# Patient Record
Sex: Female | Born: 1995 | Hispanic: Yes | Marital: Married | Smoking: Never smoker
Health system: Southern US, Community
[De-identification: ages and names within clinical notes are randomized; demographics above are authoritative.]

## PROBLEM LIST (undated history)

## (undated) ENCOUNTER — Inpatient Hospital Stay (HOSPITAL_COMMUNITY): Payer: Self-pay

## (undated) DIAGNOSIS — Z789 Other specified health status: Secondary | ICD-10-CM

## (undated) HISTORY — PX: NO PAST SURGERIES: SHX2092

---
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
## 2016-08-11 NOTE — L&D Delivery Note (Signed)
Patient is 21 y.o. G1P0 [id number] admitted for SOL. AROM at 1310.    Delivery Note At 5:53 PM a viable female was delivered via Vaginal, Spontaneous (Presentation:  LOA).  APGAR: 9, 9; weight pending.   Placenta status: Intact, ?abruption with large clots s/p delivery of palcenta.  Cord: 3V with the following complications: None.  Cord pH: N/A  Anesthesia: Epidural  Episiotomy: None Lacerations: Left Labial - hemostatic Suture Repair:None Est. Blood Loss (mL): 250  Head delivered LOA. Loose nuchal cord x2 present. Shoulder and body delivered in usual fashion. Infant with spontaneous cry, placed on mother's abdomen, dried and bulb suctioned. Cord clamped x 2 after 1-minute delay, and cut by family member. Cord blood drawn. Placenta delivered spontaneously with gentle cord traction. Fundus firm with massage and Pitocin. Perineum inspected and found to have labial laceration, which was found to be hemostatic.  Mom to postpartum.  Baby to Couplet care / Skin to Skin.  Caryl AdaJazma Badr Piedra, DO OB Fellow

## 2017-04-16 ENCOUNTER — Encounter (HOSPITAL_COMMUNITY): Payer: Self-pay | Admitting: *Deleted

## 2017-04-16 ENCOUNTER — Inpatient Hospital Stay (HOSPITAL_COMMUNITY)
Admission: AD | Admit: 2017-04-16 | Discharge: 2017-04-17 | Disposition: A | Discharge status: Home / Self Care | Payer: Medicaid Other | Source: Ambulatory Visit | Attending: Obstetrics and Gynecology | Admitting: Obstetrics and Gynecology

## 2017-04-16 ENCOUNTER — Inpatient Hospital Stay (HOSPITAL_COMMUNITY): Payer: Medicaid Other

## 2017-04-16 DIAGNOSIS — Z3A26 26 weeks gestation of pregnancy: Secondary | ICD-10-CM | POA: Insufficient documentation

## 2017-04-16 DIAGNOSIS — O26852 Spotting complicating pregnancy, second trimester: Secondary | ICD-10-CM | POA: Diagnosis not present

## 2017-04-16 DIAGNOSIS — O26853 Spotting complicating pregnancy, third trimester: Secondary | ICD-10-CM | POA: Diagnosis not present

## 2017-04-16 DIAGNOSIS — O4692 Antepartum hemorrhage, unspecified, second trimester: Secondary | ICD-10-CM | POA: Diagnosis present

## 2017-04-16 LAB — URINALYSIS, ROUTINE W REFLEX MICROSCOPIC
Bilirubin Urine: NEGATIVE
GLUCOSE, UA: NEGATIVE mg/dL
HGB URINE DIPSTICK: NEGATIVE
Ketones, ur: NEGATIVE mg/dL
Leukocytes, UA: NEGATIVE
Nitrite: NEGATIVE
PH: 7 (ref 5.0–8.0)
PROTEIN: NEGATIVE mg/dL
Specific Gravity, Urine: 1.02 (ref 1.005–1.030)

## 2017-04-16 LAB — WET PREP, GENITAL
SPERM: NONE SEEN
Trich, Wet Prep: NONE SEEN
Yeast Wet Prep HPF POC: NONE SEEN

## 2017-04-16 LAB — CBC
HEMATOCRIT: 33.5 % — AB (ref 36.0–46.0)
HEMOGLOBIN: 11.3 g/dL — AB (ref 12.0–15.0)
MCH: 29.1 pg (ref 26.0–34.0)
MCHC: 33.7 g/dL (ref 30.0–36.0)
MCV: 86.3 fL (ref 78.0–100.0)
Platelets: 252 10*3/uL (ref 150–400)
RBC: 3.88 MIL/uL (ref 3.87–5.11)
RDW: 13.5 % (ref 11.5–15.5)
WBC: 11.2 10*3/uL — ABNORMAL HIGH (ref 4.0–10.5)

## 2017-04-16 LAB — POCT PREGNANCY, URINE: Preg Test, Ur: POSITIVE — AB

## 2017-04-16 NOTE — MAU Provider Note (Signed)
History     CSN: [id number]  Arrival date and time: 04/16/17 1947   First Provider Initiated Contact with Patient 04/16/17 2229      Chief Complaint  Patient presents with  . Vaginal Bleeding   Vaginal Bleeding  The patient's primary symptoms include vaginal bleeding. The patient's pertinent negatives include no pelvic pain. This is a new problem. The current episode started yesterday. The problem occurs constantly. The problem has been unchanged. The patient is experiencing no pain. She is pregnant. Associated symptoms include nausea. Pertinent negatives include no chills, dysuria, fever, frequency or vomiting. The vaginal bleeding is lighter than menses. She has not been passing clots. She has not been passing tissue. Nothing aggravates the symptoms. She has tried nothing for the symptoms. She uses nothing for contraception. Her menstrual history has been irregular (LMP: approx 03/17/17, but last NORMAL period was 10/15/16 ).   History reviewed. No pertinent past medical history.  History reviewed. No pertinent surgical history.  Family History  Problem Relation Age of Onset  . Diabetes Maternal Grandmother   . Hypertension Maternal Grandmother   . Arthritis Maternal Grandmother   . Hypoparathyroidism Maternal Grandmother   . Cancer Maternal Grandfather     Social History  Substance Use Topics  . Smoking status: Never Smoker  . Smokeless tobacco: Never Used  . Alcohol use No    Allergies: No Known Allergies  Prescriptions Prior to Admission  Medication Sig Dispense Refill Last Dose  . Pediatric Multiple Vit-C-FA (FLINSTONES GUMMIES OMEGA-3 DHA) CHEW Chew by mouth.   04/16/2017 at Unknown time    Review of Systems  Constitutional: Negative for chills and fever.  Gastrointestinal: Positive for nausea. Negative for vomiting.  Genitourinary: Positive for vaginal bleeding. Negative for dysuria, frequency and pelvic pain.   Physical Exam   Blood pressure (!) 106/55, pulse 91,  temperature 98.1 F (36.7 C), temperature source Oral, resp. rate 18, height 4' 10.5" (1.486 m), weight 128 lb 8 oz (58.3 kg), last menstrual period 10/15/2016.  Physical Exam  Nursing note and vitals reviewed. Constitutional: She is oriented to person, place, and time. She appears well-developed and well-nourished. No distress.  HENT:  Head: Normocephalic.  Cardiovascular: Normal rate.   Respiratory: Effort normal.  GI: Soft. There is no tenderness. There is no rebound.  Fundal height 23cm, +FHT 145 with doppler   Neurological: She is alert and oriented to person, place, and time.  Skin: Skin is warm and dry.  Psychiatric: She has a normal mood and affect.  [place]: no previa Cervical length 3,2cm EGA 27.0 with corresponds with patient's LNMP in March.    Results for orders placed or performed during the hospital encounter of 04/16/17 (from the past 24 hour(s))  Urinalysis, Routine w reflex microscopic     Status: Abnormal   Collection Time: 04/16/17  8:57 PM  Result Value Ref Range   Color, Urine YELLOW YELLOW   APPearance CLOUDY (A) CLEAR   Specific Gravity, Urine 1.020 1.005 - 1.030   pH 7.0 5.0 - 8.0   Glucose, UA NEGATIVE NEGATIVE mg/dL   Hgb urine dipstick NEGATIVE NEGATIVE   Bilirubin Urine NEGATIVE NEGATIVE   Ketones, ur NEGATIVE NEGATIVE mg/dL   Protein, ur NEGATIVE NEGATIVE mg/dL   Nitrite NEGATIVE NEGATIVE   Leukocytes, UA NEGATIVE NEGATIVE  Pregnancy, urine POC     Status: Abnormal   Collection Time: 04/16/17  9:26 PM  Result Value Ref Range   Preg Test, Ur POSITIVE (A) NEGATIVE  Wet prep,  genital     Status: Abnormal   Collection Time: 04/16/17 10:30 PM  Result Value Ref Range   Yeast Wet Prep HPF POC NONE SEEN NONE SEEN   Trich, Wet Prep NONE SEEN NONE SEEN   Clue Cells Wet Prep HPF POC PRESENT (A) NONE SEEN   WBC, Wet Prep HPF POC MODERATE (A) NONE SEEN   Sperm NONE SEEN   CBC     Status: Abnormal   Collection Time: 04/16/17 10:44 PM  Result Value Ref Range    WBC 11.2 (H) 4.0 - 10.5 K/uL   RBC 3.88 3.87 - 5.11 MIL/uL   Hemoglobin 11.3 (L) 12.0 - 15.0 g/dL   HCT 01.033.5 (L) 27.236.0 - 53.646.0 %   MCV 86.3 78.0 - 100.0 fL   MCH 29.1 26.0 - 34.0 pg   MCHC 33.7 30.0 - 36.0 g/dL   RDW 64.413.5 03.411.5 - 74.215.5 %   Platelets 252 150 - 400 K/uL  ABO/Rh     Status: None (Preliminary result)   Collection Time: 04/16/17 10:45 PM  Result Value Ref Range   ABO/RH(D) O POS      FHT: 145, moderate with 10x10 accels, no decels Toco: no UCs  MAU Course  Procedures  MDM   Assessment and Plan   1. [redacted] weeks gestation of pregnancy   2. Spotting affecting pregnancy in third trimester    DC home Comfort measures reviewed  3rd Trimester precautions  PTL precautions  Fetal kick counts RX: none  Return to MAU as needed FU with OB as planned  Follow-up Information    [facility].   Specialty:  Obstetrics and Gynecology Why:  CALL FOR AN APPOINTMENT ASAP Contact information: [address] [place] [id number] [phone]           Thressa ShellerHeather Hogan 04/17/2017, 12:11 AM

## 2017-04-16 NOTE — MAU Note (Signed)
LMP -8-7,.  VAG BLEEDING STARTED YESTERDAY AT [facility] - IN UNDERWEAR- STOPPED BLEEDING  AT 4 PM .    NO CRAMPS.    HEADACHE - STARTED AT [facility]-  NO MEDS . - HAD AN ARGUMENT  AND CRYING LAST NIGHT.    NO H/A  NOW IN TRIAGE     DID HPT- 3 WEEKS - POSITIVE.

## 2017-04-17 DIAGNOSIS — Z3A26 26 weeks gestation of pregnancy: Secondary | ICD-10-CM | POA: Diagnosis not present

## 2017-04-17 DIAGNOSIS — O26853 Spotting complicating pregnancy, third trimester: Secondary | ICD-10-CM | POA: Diagnosis not present

## 2017-04-17 LAB — GC/CHLAMYDIA PROBE AMP (~~LOC~~) NOT AT ARMC
Chlamydia: NEGATIVE
NEISSERIA GONORRHEA: NEGATIVE

## 2017-04-17 LAB — ABO/RH: ABO/RH(D): O POS

## 2017-04-17 NOTE — Discharge Instructions (Signed)
Third Trimester of Pregnancy The third trimester is from week 28 through week 40 (months 7 through 9). The third trimester is a time when the unborn baby (fetus) is growing rapidly. At the end of the ninth month, the fetus is about 20 inches in length and weighs 6-10 pounds. Body changes during your third trimester Your body will continue to go through many changes during pregnancy. The changes vary from woman to woman. During the third trimester:  Your weight will continue to increase. You can expect to gain 25-35 pounds (11-16 kg) by the end of the pregnancy.  You may begin to get stretch marks on your hips, abdomen, and breasts.  You may urinate more often because the fetus is moving lower into your pelvis and pressing on your bladder.  You may develop or continue to have heartburn. This is caused by increased hormones that slow down muscles in the digestive tract.  You may develop or continue to have constipation because increased hormones slow digestion and cause the muscles that push waste through your intestines to relax.  You may develop hemorrhoids. These are swollen veins (varicose veins) in the rectum that can itch or be painful.  You may develop swollen, bulging veins (varicose veins) in your legs.  You may have increased body aches in the pelvis, back, or thighs. This is due to weight gain and increased hormones that are relaxing your joints.  You may have changes in your hair. These can include thickening of your hair, rapid growth, and changes in texture. Some women also have hair loss during or after pregnancy, or hair that feels dry or thin. Your hair will most likely return to normal after your baby is born.  Your breasts will continue to grow and they will continue to become tender. A yellow fluid (colostrum) may leak from your breasts. This is the first milk you are producing for your baby.  Your belly button may stick out.  You may notice more swelling in your hands,  face, or ankles.  You may have increased tingling or numbness in your hands, arms, and legs. The skin on your belly may also feel numb.  You may feel short of breath because of your expanding uterus.  You may have more problems sleeping. This can be caused by the size of your belly, increased need to urinate, and an increase in your body's metabolism.  You may notice the fetus "dropping," or moving lower in your abdomen (lightening).  You may have increased vaginal discharge.  You may notice your joints feel loose and you may have pain around your pelvic bone.  What to expect at prenatal visits You will have prenatal exams every 2 weeks until week 36. Then you will have weekly prenatal exams. During a routine prenatal visit:  You will be weighed to make sure you and the baby are growing normally.  Your blood pressure will be taken.  Your abdomen will be measured to track your baby's growth.  The fetal heartbeat will be listened to.  Any test results from the previous visit will be discussed.  You may have a cervical check near your due date to see if your cervix has softened or thinned (effaced).  You will be tested for Group B streptococcus. This happens between 35 and 37 weeks.  Your health care provider may ask you:  What your birth plan is.  How you are feeling.  If you are feeling the baby move.  If you have had   any abnormal symptoms, such as leaking fluid, bleeding, severe headaches, or abdominal cramping.  If you are using any tobacco products, including cigarettes, chewing tobacco, and electronic cigarettes.  If you have any questions.  Other tests or screenings that may be performed during your third trimester include:  Blood tests that check for low iron levels (anemia).  Fetal testing to check the health, activity level, and growth of the fetus. Testing is done if you have certain medical conditions or if there are problems during the  pregnancy.  Nonstress test (NST). This test checks the health of your baby to make sure there are no signs of problems, such as the baby not getting enough oxygen. During this test, a belt is placed around your belly. The baby is made to move, and its heart rate is monitored during movement.  What is false labor? False labor is a condition in which you feel small, irregular tightenings of the muscles in the womb (contractions) that usually go away with rest, changing position, or drinking water. These are called Braxton Hicks contractions. Contractions may last for hours, days, or even weeks before true labor sets in. If contractions come at regular intervals, become more frequent, increase in intensity, or become painful, you should see your health care provider. What are the signs of labor?  Abdominal cramps.  Regular contractions that start at 10 minutes apart and become stronger and more frequent with time.  Contractions that start on the top of the uterus and spread down to the lower abdomen and back.  Increased pelvic pressure and dull back pain.  A watery or bloody mucus discharge that comes from the vagina.  Leaking of amniotic fluid. This is also known as your "water breaking." It could be a slow trickle or a gush. Let your health care provider know if it has a color or strange odor. If you have any of these signs, call your health care provider right away, even if it is before your due date. Follow these instructions at home: Medicines  Follow your health care provider's instructions regarding medicine use. Specific medicines may be either safe or unsafe to take during pregnancy.  Take a prenatal vitamin that contains at least 600 micrograms (mcg) of folic acid.  If you develop constipation, try taking a stool softener if your health care provider approves. Eating and drinking  Eat a balanced diet that includes fresh fruits and vegetables, whole grains, good sources of protein  such as meat, eggs, or tofu, and low-fat dairy. Your health care provider will help you determine the amount of weight gain that is right for you.  Avoid raw meat and uncooked cheese. These carry germs that can cause birth defects in the baby.  If you have low calcium intake from food, talk to your health care provider about whether you should take a daily calcium supplement.  Eat four or five small meals rather than three large meals a day.  Limit foods that are high in fat and processed sugars, such as fried and sweet foods.  To prevent constipation: ? Drink enough fluid to keep your urine clear or pale yellow. ? Eat foods that are high in fiber, such as fresh fruits and vegetables, whole grains, and beans. Activity  Exercise only as directed by your health care provider. Most women can continue their usual exercise routine during pregnancy. Try to exercise for 30 minutes at least 5 days a week. Stop exercising if you experience uterine contractions.  Avoid heavy   lifting.  Do not exercise in extreme heat or humidity, or at high altitudes.  Wear low-heel, comfortable shoes.  Practice good posture.  You may continue to have sex unless your health care provider tells you otherwise. Relieving pain and discomfort  Take frequent breaks and rest with your legs elevated if you have leg cramps or low back pain.  Take warm sitz baths to soothe any pain or discomfort caused by hemorrhoids. Use hemorrhoid cream if your health care provider approves.  Wear a good support bra to prevent discomfort from breast tenderness.  If you develop varicose veins: ? Wear support pantyhose or compression stockings as told by your healthcare provider. ? Elevate your feet for 15 minutes, 3-4 times a day. Prenatal care  Write down your questions. Take them to your prenatal visits.  Keep all your prenatal visits as told by your health care provider. This is important. Safety  Wear your seat belt at  all times when driving.  Make a list of emergency phone numbers, including numbers for family, friends, the hospital, and police and fire departments. General instructions  Avoid cat litter boxes and soil used by cats. These carry germs that can cause birth defects in the baby. If you have a cat, ask someone to clean the litter box for you.  Do not travel far distances unless it is absolutely necessary and only with the approval of your health care provider.  Do not use hot tubs, steam rooms, or saunas.  Do not drink alcohol.  Do not use any products that contain nicotine or tobacco, such as cigarettes and e-cigarettes. If you need help quitting, ask your health care provider.  Do not use any medicinal herbs or unprescribed drugs. These chemicals affect the formation and growth of the baby.  Do not douche or use tampons or scented sanitary pads.  Do not cross your legs for long periods of time.  To prepare for the arrival of your baby: ? Take prenatal classes to understand, practice, and ask questions about labor and delivery. ? Make a trial run to the hospital. ? Visit the hospital and tour the maternity area. ? Arrange for maternity or paternity leave through employers. ? Arrange for family and friends to take care of pets while you are in the hospital. ? Purchase a rear-facing car seat and make sure you know how to install it in your car. ? Pack your hospital bag. ? Prepare the baby's nursery. Make sure to remove all pillows and stuffed animals from the baby's crib to prevent suffocation.  Visit your dentist if you have not gone during your pregnancy. Use a soft toothbrush to brush your teeth and be gentle when you floss. Contact a health care provider if:  You are unsure if you are in labor or if your water has broken.  You become dizzy.  You have mild pelvic cramps, pelvic pressure, or nagging pain in your abdominal area.  You have lower back pain.  You have persistent  nausea, vomiting, or diarrhea.  You have an unusual or bad smelling vaginal discharge.  You have pain when you urinate. Get help right away if:  Your water breaks before 37 weeks.  You have regular contractions less than 5 minutes apart before 37 weeks.  You have a fever.  You are leaking fluid from your vagina.  You have spotting or bleeding from your vagina.  You have severe abdominal pain or cramping.  You have rapid weight loss or weight gain.    You have shortness of breath with chest pain.  You notice sudden or extreme swelling of your face, hands, ankles, feet, or legs.  Your baby makes fewer than 10 movements in 2 hours.  You have severe headaches that do not go away when you take medicine.  You have vision changes. Summary  The third trimester is from week 28 through week 40, months 7 through 9. The third trimester is a time when the unborn baby (fetus) is growing rapidly.  During the third trimester, your discomfort may increase as you and your baby continue to gain weight. You may have abdominal, leg, and back pain, sleeping problems, and an increased need to urinate.  During the third trimester your breasts will keep growing and they will continue to become tender. A yellow fluid (colostrum) may leak from your breasts. This is the first milk you are producing for your baby.  False labor is a condition in which you feel small, irregular tightenings of the muscles in the womb (contractions) that eventually go away. These are called Braxton Hicks contractions. Contractions may last for hours, days, or even weeks before true labor sets in.  Signs of labor can include: abdominal cramps; regular contractions that start at 10 minutes apart and become stronger and more frequent with time; watery or bloody mucus discharge that comes from the vagina; increased pelvic pressure and dull back pain; and leaking of amniotic fluid. This information is not intended to replace advice  given to you by your health care provider. Make sure you discuss any questions you have with your health care provider. Document Released: 07/22/2001 Document Revised: 01/03/2016 Document Reviewed: 09/28/2012 [employer] Interactive Patient Education  2017 [employer]  

## 2017-05-06 ENCOUNTER — Ambulatory Visit (INDEPENDENT_AMBULATORY_CARE_PROVIDER_SITE_OTHER): Payer: Self-pay | Admitting: Medical

## 2017-05-06 ENCOUNTER — Encounter: Payer: Self-pay | Admitting: Medical

## 2017-05-06 VITALS — BP 104/56 | HR 82 | Wt 128.5 lb

## 2017-05-06 DIAGNOSIS — Z3400 Encounter for supervision of normal first pregnancy, unspecified trimester: Secondary | ICD-10-CM

## 2017-05-06 DIAGNOSIS — Z3403 Encounter for supervision of normal first pregnancy, third trimester: Secondary | ICD-10-CM

## 2017-05-06 NOTE — Patient Instructions (Signed)
Fetal Movement Counts °Patient Name: ________________________________________________ Patient Due Date: ____________________ °What is a fetal movement count? °A fetal movement count is the number of times that you feel your baby move during a certain amount of time. This may also be called a fetal kick count. A fetal movement count is recommended for every pregnant woman. You may be asked to start counting fetal movements as early as week 28 of your pregnancy. °Pay attention to when your baby is most active. You may notice your baby's sleep and wake cycles. You may also notice things that make your baby move more. You should do a fetal movement count: °· When your baby is normally most active. °· At the same time each day. ° °A good time to count movements is while you are resting, after having something to eat and drink. °How do I count fetal movements? °1. Find a quiet, comfortable area. Sit, or lie down on your side. °2. Write down the date, the start time and stop time, and the number of movements that you felt between those two times. Take this information with you to your health care visits. °3. For 2 hours, count kicks, flutters, swishes, rolls, and jabs. You should feel at least 10 movements during 2 hours. °4. You may stop counting after you have felt 10 movements. °5. If you do not feel 10 movements in 2 hours, have something to eat and drink. Then, keep resting and counting for 1 hour. If you feel at least 4 movements during that hour, you may stop counting. °Contact a health care provider if: °· You feel fewer than 4 movements in 2 hours. °· Your baby is not moving like he or she usually does. °Date: ____________ Start time: ____________ Stop time: ____________ Movements: ____________ °Date: ____________ Start time: ____________ Stop time: ____________ Movements: ____________ °Date: ____________ Start time: ____________ Stop time: ____________ Movements: ____________ °Date: ____________ Start time:  ____________ Stop time: ____________ Movements: ____________ °Date: ____________ Start time: ____________ Stop time: ____________ Movements: ____________ °Date: ____________ Start time: ____________ Stop time: ____________ Movements: ____________ °Date: ____________ Start time: ____________ Stop time: ____________ Movements: ____________ °Date: ____________ Start time: ____________ Stop time: ____________ Movements: ____________ °Date: ____________ Start time: ____________ Stop time: ____________ Movements: ____________ °This information is not intended to replace advice given to you by your health care provider. Make sure you discuss any questions you have with your health care provider. °Document Released: 08/27/2006 Document Revised: 03/26/2016 Document Reviewed: 09/06/2015 °Elsevier Interactive Patient Education © 2018 Elsevier Inc[employer] °Braxton Hicks Contractions °Contractions of the uterus can occur throughout pregnancy, but they are not always a sign that you are in labor. You may have practice contractions called Braxton Hicks contractions. These false labor contractions are sometimes confused with true labor. °What are Braxton Hicks contractions? °Braxton Hicks contractions are tightening movements that occur in the muscles of the uterus before labor. Unlike true labor contractions, these contractions do not result in opening (dilation) and thinning of the cervix. Toward the end of pregnancy (32-34 weeks), Braxton Hicks contractions can happen more often and may become stronger. These contractions are sometimes difficult to tell apart from true labor because they can be very uncomfortable. You should not feel embarrassed if you go to the hospital with false labor. °Sometimes, the only way to tell if you are in true labor is for your health care provider to look for changes in the cervix. The health care provider will do a physical exam and may monitor your contractions. If   you are not in true labor, the exam  should show that your cervix is not dilating and your water has not broken. °If there are no prenatal problems or other health problems associated with your pregnancy, it is completely safe for you to be sent home with false labor. You may continue to have Braxton Hicks contractions until you go into true labor. °How can I tell the difference between true labor and false labor? °· Differences °? False labor °? Contractions last 30-70 seconds.: Contractions are usually shorter and not as strong as true labor contractions. °? Contractions become very regular.: Contractions are usually irregular. °? Discomfort is usually felt in the top of the uterus, and it spreads to the lower abdomen and low back.: Contractions are often felt in the front of the lower abdomen and in the groin. °? Contractions do not go away with walking.: Contractions may go away when you walk around or change positions while lying down. °? Contractions usually become more intense and increase in frequency.: Contractions get weaker and are shorter-lasting as time goes on. °? The cervix dilates and gets thinner.: The cervix usually does not dilate or become thin. °Follow these instructions at home: °· Take over-the-counter and prescription medicines only as told by your health care provider. °· Keep up with your usual exercises and follow other instructions from your health care provider. °· Eat and drink lightly if you think you are going into labor. °· If Braxton Hicks contractions are making you uncomfortable: °? Change your position from lying down or resting to walking, or change from walking to resting. °? Sit and rest in a tub of warm water. °? Drink enough fluid to keep your urine clear or pale yellow. Dehydration may cause these contractions. °? Do slow and deep breathing several times an hour. °· Keep all follow-up prenatal visits as told by your health care provider. This is important. °Contact a health care provider if: °· You have a  fever. °· You have continuous pain in your abdomen. °Get help right away if: °· Your contractions become stronger, more regular, and closer together. °· You have fluid leaking or gushing from your vagina. °· You pass blood-tinged mucus (bloody show). °· You have bleeding from your vagina. °· You have low back pain that you never had before. °· You feel your baby’s head pushing down and causing pelvic pressure. °· Your baby is not moving inside you as much as it used to. °Summary °· Contractions that occur before labor are called Braxton Hicks contractions, false labor, or practice contractions. °· Braxton Hicks contractions are usually shorter, weaker, farther apart, and less regular than true labor contractions. True labor contractions usually become progressively stronger and regular and they become more frequent. °· Manage discomfort from Braxton Hicks contractions by changing position, resting in a warm bath, drinking plenty of water, or practicing deep breathing. °This information is not intended to replace advice given to you by your health care provider. Make sure you discuss any questions you have with your health care provider. °Document Released: 07/28/2005 Document Revised: 06/16/2016 Document Reviewed: 06/16/2016 °Elsevier Interactive Patient Education © 2017 Elsevier Inc. ° °

## 2017-05-06 NOTE — Progress Notes (Signed)
Pt will return on Friday at 8 am to do initial labs and 2hr gtt. Will also get tdap and flu at this time.

## 2017-05-06 NOTE — Progress Notes (Signed)
   PRENATAL VISIT NOTE  Subjective:  Linda Zavala is a 21 y.o. G1P0 at [redacted]w[redacted]d being seen today for initial prenatal visit. She is currently monitored for the following issues for this low-risk pregnancy and has Supervision of normal first pregnancy, antepartum on her problem list.  Patient reports no complaints.  Contractions: Irritability. Vag. Bleeding: None.  Movement: Present. Denies leaking of fluid.   The following portions of the patient's history were reviewed and updated as appropriate: allergies, current medications, past family history, past medical history, past social history, past surgical history and problem list. Problem list updated.  Objective:   Vitals:   05/06/17 1441  BP: (!) 104/56  Pulse: 82  Weight: 128 lb 8 oz (58.3 kg)    Fetal Status: Fetal Heart Rate (bpm): 150 Fundal Height: 26 cm Movement: Present     General:  Alert, oriented and cooperative. Patient is in no acute distress.  Skin: Skin is warm and dry. No rash noted.   Cardiovascular: Normal heart rate noted  Respiratory: Normal respiratory effort, no problems with respiration noted  Abdomen: Soft, gravid, appropriate for gestational age.  Pain/Pressure: Present     Pelvic: Cervical exam deferred        Extremities: Normal range of motion.  Edema: None  Mental Status:  Normal mood and affect. Normal behavior. Normal judgment and thought content.   Assessment and Plan:  Pregnancy: G1P0 at [redacted]w[redacted]d  1. Supervision of normal first pregnancy, antepartum - [place] MFM OB COMP + 14 WK; scheduled for Friday - Patient to return for prenatal labs and 2 hour GTT on Friday morning when fasting - Recent GC/Chlmaydia negative in [facility]  - Discussed the nature of our practice and expectations for visit timeline for this pregnancy - All questions answered  Preterm labor symptoms and general obstetric precautions including but not limited to vaginal bleeding, contractions, leaking of fluid and fetal movement were  reviewed in detail with the patient. Please refer to After Visit Summary for other counseling recommendations.  Return in about 2 weeks (around 05/20/2017) for LOB.   Vonzella Nipple, PA-C

## 2017-05-08 ENCOUNTER — Other Ambulatory Visit: Payer: Self-pay | Admitting: Medical

## 2017-05-08 ENCOUNTER — Other Ambulatory Visit: Payer: Self-pay

## 2017-05-08 ENCOUNTER — Encounter (HOSPITAL_COMMUNITY): Payer: Self-pay

## 2017-05-08 ENCOUNTER — Ambulatory Visit (HOSPITAL_COMMUNITY)
Admission: RE | Admit: 2017-05-08 | Discharge: 2017-05-08 | Disposition: A | Discharge status: Home / Self Care | Payer: Medicaid Other | Source: Ambulatory Visit | Attending: Medical | Admitting: Medical

## 2017-05-08 DIAGNOSIS — Z3689 Encounter for other specified antenatal screening: Secondary | ICD-10-CM | POA: Diagnosis not present

## 2017-05-08 DIAGNOSIS — O0933 Supervision of pregnancy with insufficient antenatal care, third trimester: Secondary | ICD-10-CM

## 2017-05-08 DIAGNOSIS — Z3A29 29 weeks gestation of pregnancy: Secondary | ICD-10-CM | POA: Insufficient documentation

## 2017-05-08 DIAGNOSIS — Z3400 Encounter for supervision of normal first pregnancy, unspecified trimester: Secondary | ICD-10-CM

## 2017-05-08 HISTORY — DX: Other specified health status: Z78.9

## 2017-05-09 LAB — HIV ANTIBODY (ROUTINE TESTING W REFLEX): HIV SCREEN 4TH GENERATION: NONREACTIVE

## 2017-05-09 LAB — CBC
HEMOGLOBIN: 11.9 g/dL (ref 11.1–15.9)
Hematocrit: 35.3 % (ref 34.0–46.6)
MCH: 29.1 pg (ref 26.6–33.0)
MCHC: 33.7 g/dL (ref 31.5–35.7)
MCV: 86 fL (ref 79–97)
Platelets: 252 10*3/uL (ref 150–379)
RBC: 4.09 x10E6/uL (ref 3.77–5.28)
RDW: 14.7 % (ref 12.3–15.4)
WBC: 9 10*3/uL (ref 3.4–10.8)

## 2017-05-09 LAB — GLUCOSE TOLERANCE, 2 HOURS W/ 1HR
GLUCOSE, 1 HOUR: 97 mg/dL (ref 65–179)
GLUCOSE, 2 HOUR: 89 mg/dL (ref 65–152)
Glucose, Fasting: 78 mg/dL (ref 65–91)

## 2017-05-09 LAB — RPR: RPR: NONREACTIVE

## 2017-05-20 ENCOUNTER — Ambulatory Visit (INDEPENDENT_AMBULATORY_CARE_PROVIDER_SITE_OTHER): Payer: Self-pay | Admitting: Medical

## 2017-05-20 VITALS — BP 121/64 | HR 79 | Wt 128.0 lb

## 2017-05-20 DIAGNOSIS — Z3403 Encounter for supervision of normal first pregnancy, third trimester: Secondary | ICD-10-CM

## 2017-05-20 DIAGNOSIS — Z3400 Encounter for supervision of normal first pregnancy, unspecified trimester: Secondary | ICD-10-CM

## 2017-05-20 NOTE — Patient Instructions (Signed)
Fetal Movement Counts °Patient Name: ________________________________________________ Patient Due Date: ____________________ °What is a fetal movement count? °A fetal movement count is the number of times that you feel your baby move during a certain amount of time. This may also be called a fetal kick count. A fetal movement count is recommended for every pregnant woman. You may be asked to start counting fetal movements as early as week 28 of your pregnancy. °Pay attention to when your baby is most active. You may notice your baby's sleep and wake cycles. You may also notice things that make your baby move more. You should do a fetal movement count: °· When your baby is normally most active. °· At the same time each day. ° °A good time to count movements is while you are resting, after having something to eat and drink. °How do I count fetal movements? °1. Find a quiet, comfortable area. Sit, or lie down on your side. °2. Write down the date, the start time and stop time, and the number of movements that you felt between those two times. Take this information with you to your health care visits. °3. For 2 hours, count kicks, flutters, swishes, rolls, and jabs. You should feel at least 10 movements during 2 hours. °4. You may stop counting after you have felt 10 movements. °5. If you do not feel 10 movements in 2 hours, have something to eat and drink. Then, keep resting and counting for 1 hour. If you feel at least 4 movements during that hour, you may stop counting. °Contact a health care provider if: °· You feel fewer than 4 movements in 2 hours. °· Your baby is not moving like he or she usually does. °Date: ____________ Start time: ____________ Stop time: ____________ Movements: ____________ °Date: ____________ Start time: ____________ Stop time: ____________ Movements: ____________ °Date: ____________ Start time: ____________ Stop time: ____________ Movements: ____________ °Date: ____________ Start time:  ____________ Stop time: ____________ Movements: ____________ °Date: ____________ Start time: ____________ Stop time: ____________ Movements: ____________ °Date: ____________ Start time: ____________ Stop time: ____________ Movements: ____________ °Date: ____________ Start time: ____________ Stop time: ____________ Movements: ____________ °Date: ____________ Start time: ____________ Stop time: ____________ Movements: ____________ °Date: ____________ Start time: ____________ Stop time: ____________ Movements: ____________ °This information is not intended to replace advice given to you by your health care provider. Make sure you discuss any questions you have with your health care provider. °Document Released: 08/27/2006 Document Revised: 03/26/2016 Document Reviewed: 09/06/2015 °Elsevier Interactive Patient Education © 2018 Elsevier Inc[employer] °Braxton Hicks Contractions °Contractions of the uterus can occur throughout pregnancy, but they are not always a sign that you are in labor. You may have practice contractions called Braxton Hicks contractions. These false labor contractions are sometimes confused with true labor. °What are Braxton Hicks contractions? °Braxton Hicks contractions are tightening movements that occur in the muscles of the uterus before labor. Unlike true labor contractions, these contractions do not result in opening (dilation) and thinning of the cervix. Toward the end of pregnancy (32-34 weeks), Braxton Hicks contractions can happen more often and may become stronger. These contractions are sometimes difficult to tell apart from true labor because they can be very uncomfortable. You should not feel embarrassed if you go to the hospital with false labor. °Sometimes, the only way to tell if you are in true labor is for your health care provider to look for changes in the cervix. The health care provider will do a physical exam and may monitor your contractions. If   you are not in true labor, the exam  should show that your cervix is not dilating and your water has not broken. °If there are no prenatal problems or other health problems associated with your pregnancy, it is completely safe for you to be sent home with false labor. You may continue to have Braxton Hicks contractions until you go into true labor. °How can I tell the difference between true labor and false labor? °· Differences °? False labor °? Contractions last 30-70 seconds.: Contractions are usually shorter and not as strong as true labor contractions. °? Contractions become very regular.: Contractions are usually irregular. °? Discomfort is usually felt in the top of the uterus, and it spreads to the lower abdomen and low back.: Contractions are often felt in the front of the lower abdomen and in the groin. °? Contractions do not go away with walking.: Contractions may go away when you walk around or change positions while lying down. °? Contractions usually become more intense and increase in frequency.: Contractions get weaker and are shorter-lasting as time goes on. °? The cervix dilates and gets thinner.: The cervix usually does not dilate or become thin. °Follow these instructions at home: °· Take over-the-counter and prescription medicines only as told by your health care provider. °· Keep up with your usual exercises and follow other instructions from your health care provider. °· Eat and drink lightly if you think you are going into labor. °· If Braxton Hicks contractions are making you uncomfortable: °? Change your position from lying down or resting to walking, or change from walking to resting. °? Sit and rest in a tub of warm water. °? Drink enough fluid to keep your urine clear or pale yellow. Dehydration may cause these contractions. °? Do slow and deep breathing several times an hour. °· Keep all follow-up prenatal visits as told by your health care provider. This is important. °Contact a health care provider if: °· You have a  fever. °· You have continuous pain in your abdomen. °Get help right away if: °· Your contractions become stronger, more regular, and closer together. °· You have fluid leaking or gushing from your vagina. °· You pass blood-tinged mucus (bloody show). °· You have bleeding from your vagina. °· You have low back pain that you never had before. °· You feel your baby’s head pushing down and causing pelvic pressure. °· Your baby is not moving inside you as much as it used to. °Summary °· Contractions that occur before labor are called Braxton Hicks contractions, false labor, or practice contractions. °· Braxton Hicks contractions are usually shorter, weaker, farther apart, and less regular than true labor contractions. True labor contractions usually become progressively stronger and regular and they become more frequent. °· Manage discomfort from Braxton Hicks contractions by changing position, resting in a warm bath, drinking plenty of water, or practicing deep breathing. °This information is not intended to replace advice given to you by your health care provider. Make sure you discuss any questions you have with your health care provider. °Document Released: 07/28/2005 Document Revised: 06/16/2016 Document Reviewed: 06/16/2016 °Elsevier Interactive Patient Education © 2017 Elsevier Inc. ° °

## 2017-05-20 NOTE — Progress Notes (Signed)
   PRENATAL VISIT NOTE  Subjective:  Linda Zavala is a 21 y.o. G1P0 at [redacted]w[redacted]d being seen today for ongoing prenatal care.  She is currently monitored for the following issues for this low-risk pregnancy and has Supervision of normal first pregnancy, antepartum on her problem list.  Patient reports no complaints.  Contractions: Not present. Vag. Bleeding: None.  Movement: Present. Denies leaking of fluid.   The following portions of the patient's history were reviewed and updated as appropriate: allergies, current medications, past family history, past medical history, past social history, past surgical history and problem list. Problem list updated.  Objective:   Vitals:   05/20/17 1107  BP: 121/64  Pulse: 79  Weight: 128 lb (58.1 kg)    Fetal Status: Fetal Heart Rate (bpm): 157 Fundal Height: 28 cm Movement: Present     General:  Alert, oriented and cooperative. Patient is in no acute distress.  Skin: Skin is warm and dry. No rash noted.   Cardiovascular: Normal heart rate noted  Respiratory: Normal respiratory effort, no problems with respiration noted  Abdomen: Soft, gravid, appropriate for gestational age.  Pain/Pressure: Absent     Pelvic: Cervical exam deferred        Extremities: Normal range of motion.  Edema: None  Mental Status:  Normal mood and affect. Normal behavior. Normal judgment and thought content.   Assessment and Plan:  Pregnancy: G1P0 at 100w0d  1. Supervision of normal first pregnancy, antepartum - Rubella screen - Hepatitis B Surface AntiGEN - Hemoglobinopathy evaluation - Antibody screen - [place] results from 05/08/17 reviewed. EFW 76th%tile at that time, if fundal height continues to measure < dates, will consider re-scan for growth around 35-36 weeks   Preterm labor symptoms and general obstetric precautions including but not limited to vaginal bleeding, contractions, leaking of fluid and fetal movement were reviewed in detail with the patient. Please  refer to After Visit Summary for other counseling recommendations.  Return in about 2 weeks (around 06/03/2017) for LOB.   Vonzella Nipple, PA-C

## 2017-05-21 LAB — ANTIBODY SCREEN: Antibody Screen: NEGATIVE

## 2017-05-22 LAB — HEMOGLOBINOPATHY EVALUATION
HEMOGLOBIN A2 QUANTITATION: 2.2 % (ref 1.8–3.2)
HGB C: 0 %
HGB S: 0 %
HGB VARIANT: 0 %
Hemoglobin F Quantitation: 0 % (ref 0.0–2.0)
Hgb A: 97.8 % (ref 96.4–98.8)

## 2017-05-22 LAB — RUBELLA SCREEN: Rubella Antibodies, IGG: <0.9 index — ABNORMAL LOW (ref >0.99)

## 2017-05-22 LAB — HEPATITIS B SURFACE ANTIGEN: Hepatitis B Surface Ag: NEGATIVE

## 2017-06-03 ENCOUNTER — Ambulatory Visit (INDEPENDENT_AMBULATORY_CARE_PROVIDER_SITE_OTHER): Payer: Self-pay | Admitting: Advanced Practice Midwife

## 2017-06-03 VITALS — BP 113/62 | HR 88 | Wt 130.2 lb

## 2017-06-03 DIAGNOSIS — Z3403 Encounter for supervision of normal first pregnancy, third trimester: Secondary | ICD-10-CM

## 2017-06-03 DIAGNOSIS — Z3400 Encounter for supervision of normal first pregnancy, unspecified trimester: Secondary | ICD-10-CM

## 2017-06-03 NOTE — Patient Instructions (Addendum)
AREA PEDIATRIC/FAMILY PRACTICE PHYSICIANS  [facility] [address], [address] [place], [place]  [address] Phone - [phone]   Fax - [phone]  [facility] [address] [place], [place] [address] Phone - [phone]   Fax - [phone]  [facility] [address] [place], [place]  [address] Phone - [phone]   Fax - [phone]  [facility] [address] [place], [place]  [address] Phone - [phone]   Fax - [phone]  [facility] [address] [place], [place]  [address] Phone - [phone]   Fax - [phone]  [facility] [address] [place], [place]  [address] Phone - [phone]   Fax - [phone]  [facility] [address] [place], [place]  [address] Phone - [phone]   Fax - [phone]  [facility] [address] [place], [place]  [address] Phone - [phone]   Fax - [phone]  [facility] [address] [place], [place]  [address] Phone - [phone]   Fax - [phone] [facility] [address] [place], [place]  [address] Phone - [phone]   Fax - [phone]  [facility] [address] [place], [place]  [address] Phone - [phone]   Fax - [phone]  [facility] [address] [place], [place]  [address] Phone - [phone]   Fax - [phone]  [facility] [address] [place], [place]  [address] Phone - [phone]   Fax - [phone]  [facility] [address] [place], [place]  [address] Phone - [phone]  [facility] [address] [place], [place]  [address] Phone - [phone]   Fax - [phone]  [facility] [address] [place], [place]  [address] Phone - [phone]   Fax - [phone]  [facility] [address] [place], [place]  [address] Phone - [phone]   Fax - [phone]  [facility] [address] [place], [place]  [address] Phone - [phone]   Fax - [phone]   [facility] [address] [place], [place]  [address] Phone - [phone]   Fax - [phone]  [facility] [address] [place], [place]  [address] Phone - [phone]   Fax - [phone]  [facility] [address] [place], [place]  [address] Phone - [phone]   Fax - [phone]  [facility] [address] [place], [place]  [address] Phone - [phone]   Fax - [phone]  [facility] [address] , [place]  [address] Phone - [phone]   Fax - [phone] [facility] [address] [place], [place]  [address] Phone - [phone]   Fax - [phone]  [facility] [address] [place], [place]  [address] Phone - [phone]   Fax - [phone]  [facility] [address] [place],   [address] Phone - [phone]   Fax - [phone]  [facility] Charlene Flemming MD [address] [place]  [address] Phone [phone]  Fax [phone]  Contraception Choices Contraception (birth control) is the use of any methods or devices to prevent   pregnancy. Below are some methods to help avoid pregnancy. Hormonal methods  Contraceptive implant. This is a thin, plastic tube containing progesterone hormone. It does not contain estrogen hormone. Your health care provider inserts the tube in the inner part of the upper arm. The tube can remain in place for up to 3 years. After 3 years, the implant must be removed. The implant prevents the  ovaries from releasing an egg (ovulation), thickens the cervical mucus to prevent sperm from entering the uterus, and thins the lining of the inside of the uterus.  Progesterone-only injections. These injections are given every 3 months by your health care provider to prevent pregnancy. This synthetic progesterone hormone stops the ovaries from releasing eggs. It also thickens cervical mucus and changes the uterine lining. This makes it harder for sperm to survive in the uterus.  Birth control pills. These pills contain estrogen and progesterone hormone. They work by preventing the ovaries from releasing eggs (ovulation). They also cause the cervical mucus to thicken, preventing the sperm from entering the uterus. Birth control pills are prescribed by a health care provider.Birth control pills can also be used to treat heavy periods.  Minipill. This type of birth control pill contains only the progesterone hormone. They are taken every day of each month and must be prescribed by your health care provider.  Birth control patch. The patch contains hormones similar to those in birth control pills. It must be changed once a week and is prescribed by a health care provider.  Vaginal ring. The ring contains hormones similar to those in birth control pills. It is left in the vagina for 3 weeks, removed for 1 week, and then a new one is put back in place. The patient must be comfortable inserting and removing the ring from the vagina.A health care provider's prescription is necessary.  Emergency contraception. Emergency contraceptives prevent pregnancy after unprotected sexual intercourse. This pill can be taken right after sex or up to 5 days after unprotected sex. It is most effective the sooner you take the pills after having sexual intercourse. Most emergency contraceptive pills are available without a prescription. Check with your pharmacist. Do not use emergency contraception as your only form of birth  control. Barrier methods  Female condom. This is a thin sheath (latex or rubber) that is worn over the penis during sexual intercourse. It can be used with spermicide to increase effectiveness.  Female condom. This is a soft, loose-fitting sheath that is put into the vagina before sexual intercourse.  Diaphragm. This is a soft, latex, dome-shaped barrier that must be fitted by a health care provider. It is inserted into the vagina, along with a spermicidal jelly. It is inserted before intercourse. The diaphragm should be left in the vagina for 6 to 8 hours after intercourse.  Cervical cap. This is a round, soft, latex or plastic cup that fits over the cervix and must be fitted by a health care provider. The cap can be left in place for up to 48 hours after intercourse.  Sponge. This is a soft, circular piece of polyurethane foam. The sponge has spermicide in it. It is inserted into the vagina after wetting it and before sexual intercourse.  Spermicides. These are chemicals that kill or block sperm from entering the cervix and uterus. They come in the form of creams, jellies, suppositories, foam, or tablets. They do not require a prescription. They are inserted into the vagina with an applicator before having sexual intercourse. The process   must be repeated every time you have sexual intercourse. Intrauterine contraception  Intrauterine device (IUD). This is a T-shaped device that is put in a woman's uterus during a menstrual period to prevent pregnancy. There are 2 types: ? Copper IUD. This type of IUD is wrapped in copper wire and is placed inside the uterus. Copper makes the uterus and fallopian tubes produce a fluid that kills sperm. It can stay in place for 10 years. ? Hormone IUD. This type of IUD contains the hormone progestin (synthetic progesterone). The hormone thickens the cervical mucus and prevents sperm from entering the uterus, and it also thins the uterine lining to prevent  implantation of a fertilized egg. The hormone can weaken or kill the sperm that get into the uterus. It can stay in place for 3-5 years, depending on which type of IUD is used. Permanent methods of contraception  Female tubal ligation. This is when the woman's fallopian tubes are surgically sealed, tied, or blocked to prevent the egg from traveling to the uterus.  Hysteroscopic sterilization. This involves placing a small coil or insert into each fallopian tube. Your doctor uses a technique called hysteroscopy to do the procedure. The device causes scar tissue to form. This results in permanent blockage of the fallopian tubes, so the sperm cannot fertilize the egg. It takes about 3 months after the procedure for the tubes to become blocked. You must use another form of birth control for these 3 months.  Female sterilization. This is when the female has the tubes that carry sperm tied off (vasectomy).This blocks sperm from entering the vagina during sexual intercourse. After the procedure, the man can still ejaculate fluid (semen). Natural planning methods  Natural family planning. This is not having sexual intercourse or using a barrier method (condom, diaphragm, cervical cap) on days the woman could become pregnant.  Calendar method. This is keeping track of the length of each menstrual cycle and identifying when you are fertile.  Ovulation method. This is avoiding sexual intercourse during ovulation.  Symptothermal method. This is avoiding sexual intercourse during ovulation, using a thermometer and ovulation symptoms.  Post-ovulation method. This is timing sexual intercourse after you have ovulated. Regardless of which type or method of contraception you choose, it is important that you use condoms to protect against the transmission of sexually transmitted infections (STIs). Talk with your health care provider about which form of contraception is most appropriate for you. This information is not  intended to replace advice given to you by your health care provider. Make sure you discuss any questions you have with your health care provider. Document Released: 07/28/2005 Document Revised: 01/03/2016 Document Reviewed: 01/20/2013 Elsevier Interactive Patient Education  2017 Elsevier Inc.  

## 2017-06-03 NOTE — Progress Notes (Signed)
   PRENATAL VISIT NOTE  Subjective:  Linda Zavala is a 21 y.o. G1P0 at 10827w0d being seen today for ongoing prenatal care.  She is currently monitored for the following issues for this low-risk pregnancy and has Supervision of normal first pregnancy, antepartum on her problem list.  Patient reports no complaints.  Contractions: Not present. Vag. Bleeding: None.  Movement: Present. Denies leaking of fluid.   The following portions of the patient's history were reviewed and updated as appropriate: allergies, current medications, past family history, past medical history, past social history, past surgical history and problem list. Problem list updated.  Objective:   Vitals:   06/03/17 1150  BP: 113/62  Pulse: 88  Weight: 130 lb 3.2 oz (59.1 kg)    Fetal Status: Fetal Heart Rate (bpm): 140 Fundal Height: 32 cm Movement: Present     General:  Alert, oriented and cooperative. Patient is in no acute distress.  Skin: Skin is warm and dry. No rash noted.   Cardiovascular: Normal heart rate noted  Respiratory: Normal respiratory effort, no problems with respiration noted  Abdomen: Soft, gravid, appropriate for gestational age.  Pain/Pressure: Absent     Pelvic: Cervical exam deferred        Extremities: Normal range of motion.  Edema: None  Mental Status:  Normal mood and affect. Normal behavior. Normal judgment and thought content.   Assessment and Plan:  Pregnancy: G1P0 at 4327w0d  1. Supervision of normal first pregnancy, antepartum - Routine care -GBS at next visit - Planning on applying for Medicaid.   -Contraceptions choices list given - discussed inpatient Nexplanon    Preterm labor symptoms and general obstetric precautions including but not limited to vaginal bleeding, contractions, leaking of fluid and fetal movement were reviewed in detail with the patient. Please refer to After Visit Summary for other counseling recommendations.  Return in about 2 weeks (around  06/17/2017).   Thressa ShellerHeather Samiah Ricklefs, CNM

## 2017-06-17 ENCOUNTER — Ambulatory Visit (INDEPENDENT_AMBULATORY_CARE_PROVIDER_SITE_OTHER): Payer: Medicaid Other | Admitting: Family Medicine

## 2017-06-17 VITALS — BP 102/62 | HR 77 | Wt 130.0 lb

## 2017-06-17 DIAGNOSIS — Z3403 Encounter for supervision of normal first pregnancy, third trimester: Secondary | ICD-10-CM

## 2017-06-17 DIAGNOSIS — Z3400 Encounter for supervision of normal first pregnancy, unspecified trimester: Secondary | ICD-10-CM

## 2017-06-17 DIAGNOSIS — Z113 Encounter for screening for infections with a predominantly sexual mode of transmission: Secondary | ICD-10-CM | POA: Diagnosis not present

## 2017-06-17 LAB — OB RESULTS CONSOLE GBS: STREP GROUP B AG: NEGATIVE

## 2017-06-17 LAB — OB RESULTS CONSOLE GC/CHLAMYDIA: Gonorrhea: NEGATIVE

## 2017-06-17 NOTE — Patient Instructions (Signed)

## 2017-06-17 NOTE — Progress Notes (Signed)
   PRENATAL VISIT NOTE  Subjective:  Linda Zavala is a 21 y.o. G1P0 at 4931w0d being seen today for ongoing prenatal care.  She is currently monitored for the following issues for this low-risk pregnancy and has Supervision of normal first pregnancy, antepartum on their problem list.  Patient reports no complaints.  Contractions: Irritability. Vag. Bleeding: None.  Movement: Present. Denies leaking of fluid.   The following portions of the patient's history were reviewed and updated as appropriate: allergies, current medications, past family history, past medical history, past social history, past surgical history and problem list. Problem list updated.  Objective:   Vitals:   06/17/17 1416  BP: 102/62  Pulse: 77  Weight: 130 lb (59 kg)    Fetal Status: Fetal Heart Rate (bpm): 160 Fundal Height: 35 cm Movement: Present     General:  Alert, oriented and cooperative. Patient is in no acute distress.  Skin: Skin is warm and dry. No rash noted.   Cardiovascular: Normal heart rate noted  Respiratory: Normal respiratory effort, no problems with respiration noted  Abdomen: Soft, gravid, appropriate for gestational age.  Pain/Pressure: Absent     Pelvic: Cervical exam deferred        Extremities: Normal range of motion.  Edema: None  Mental Status:  Normal mood and affect. Normal behavior. Normal judgment and thought content.   Assessment and Plan:  Pregnancy: G1P0 at 4931w0d  1. Pregnancy at [redacted] weeks gestation -GBS today -gc/chlamydia today   Preterm labor symptoms and general obstetric precautions including but not limited to vaginal bleeding, contractions, leaking of fluid and fetal movement were reviewed in detail with the patient. Please refer to After Visit Summary for other counseling recommendations.  Return in about 1 week (around 06/24/2017).   Rolm BookbinderAmber Joani Cosma, DO

## 2017-06-18 LAB — GC/CHLAMYDIA PROBE AMP (~~LOC~~) NOT AT ARMC
Chlamydia: NEGATIVE
Neisseria Gonorrhea: NEGATIVE

## 2017-06-21 LAB — CULTURE, BETA STREP (GROUP B ONLY): STREP GP B CULTURE: NEGATIVE

## 2017-06-24 ENCOUNTER — Encounter: Payer: Self-pay | Admitting: Advanced Practice Midwife

## 2017-06-24 ENCOUNTER — Ambulatory Visit (INDEPENDENT_AMBULATORY_CARE_PROVIDER_SITE_OTHER): Payer: Self-pay | Admitting: Advanced Practice Midwife

## 2017-06-24 VITALS — BP 108/68 | HR 84 | Wt 131.0 lb

## 2017-06-24 DIAGNOSIS — Z3400 Encounter for supervision of normal first pregnancy, unspecified trimester: Secondary | ICD-10-CM

## 2017-06-24 DIAGNOSIS — Z3403 Encounter for supervision of normal first pregnancy, third trimester: Secondary | ICD-10-CM

## 2017-06-24 NOTE — Progress Notes (Signed)
   PRENATAL VISIT NOTE  Subjective:  Vertell Limberlejandra Murren is a 21 y.o. G1P0 at 2199w0d being seen today for ongoing prenatal care.  She is currently monitored for the following issues for this low-risk pregnancy and has Supervision of normal first pregnancy, antepartum on their problem list.  Patient reports no complaints.  Contractions: Not present. Vag. Bleeding: None.  Movement: Present. Denies leaking of fluid.   The following portions of the patient's history were reviewed and updated as appropriate: allergies, current medications, past family history, past medical history, past social history, past surgical history and problem list. Problem list updated.  Objective:   Vitals:   06/24/17 1616  BP: 108/68  Pulse: 84  Weight: 131 lb (59.4 kg)    Fetal Status: Fetal Heart Rate (bpm): 145   Movement: Present     General:  Alert, oriented and cooperative. Patient is in no acute distress.  Skin: Skin is warm and dry. No rash noted.   Cardiovascular: Normal heart rate noted  Respiratory: Normal respiratory effort, no problems with respiration noted  Abdomen: Soft, gravid, appropriate for gestational age.  Pain/Pressure: Absent     Pelvic: Cervical exam performed        Extremities: Normal range of motion.  Edema: None  Mental Status:  Normal mood and affect. Normal behavior. Normal judgment and thought content.   Assessment and Plan:  Pregnancy: G1P0 at 3999w0d  1. Supervision of normal first pregnancy, antepartum      Signs of labor reviewed      Reviewed cultures and GBS negative  Term labor symptoms and general obstetric precautions including but not limited to vaginal bleeding, contractions, leaking of fluid and fetal movement were reviewed in detail with the patient. Please refer to After Visit Summary for other counseling recommendations.  Return in about 1 week (around 07/01/2017) for Low Risk Clinic.   Wynelle BourgeoisMarie Naomy Esham, CNM

## 2017-06-24 NOTE — Patient Instructions (Signed)

## 2017-06-30 ENCOUNTER — Ambulatory Visit (INDEPENDENT_AMBULATORY_CARE_PROVIDER_SITE_OTHER): Payer: Self-pay | Admitting: Advanced Practice Midwife

## 2017-06-30 ENCOUNTER — Encounter: Payer: Self-pay | Admitting: Advanced Practice Midwife

## 2017-06-30 DIAGNOSIS — Z3403 Encounter for supervision of normal first pregnancy, third trimester: Secondary | ICD-10-CM

## 2017-06-30 DIAGNOSIS — Z3400 Encounter for supervision of normal first pregnancy, unspecified trimester: Secondary | ICD-10-CM

## 2017-06-30 NOTE — Progress Notes (Signed)
   PRENATAL VISIT NOTE  Subjective:  Linda Zavala is a 21 y.o. G1P0 at 3585w6d being seen today for ongoing prenatal care.  She is currently monitored for the following issues for this low-risk pregnancy and has Supervision of normal first pregnancy, antepartum on their problem list.  Patient reports no complaints.  Contractions: Not present. Vag. Bleeding: None.  Movement: Present. Denies leaking of fluid.   The following portions of the patient's history were reviewed and updated as appropriate: allergies, current medications, past family history, past medical history, past social history, past surgical history and problem list. Problem list updated.  Objective:   Vitals:   06/30/17 1244  BP: (!) 127/97  Pulse: 97  Weight: 132 lb (59.9 kg)    Fetal Status: Fetal Heart Rate (bpm): 148   Movement: Present     General:  Alert, oriented and cooperative. Patient is in no acute distress.  Skin: Skin is warm and dry. No rash noted.   Cardiovascular: Normal heart rate noted  Respiratory: Normal respiratory effort, no problems with respiration noted  Abdomen: Soft, gravid, appropriate for gestational age.  Pain/Pressure: Absent     Pelvic: Cervical exam deferred        Extremities: Normal range of motion.  Edema: None  Mental Status:  Normal mood and affect. Normal behavior. Normal judgment and thought content.   Assessment and Plan:  Pregnancy: G1P0 at 2385w6d  1. Supervision of normal first pregnancy, antepartum      Reviewed signs of labor      Plans to continue working for now  Term labor symptoms and general obstetric precautions including but not limited to vaginal bleeding, contractions, leaking of fluid and fetal movement were reviewed in detail with the patient. Please refer to After Visit Summary for other counseling recommendations.  Return in about 1 week (around 07/07/2017) for Low Risk Clinic.   Wynelle BourgeoisMarie My Rinke, CNM

## 2017-06-30 NOTE — Patient Instructions (Signed)

## 2017-07-07 ENCOUNTER — Ambulatory Visit (INDEPENDENT_AMBULATORY_CARE_PROVIDER_SITE_OTHER): Payer: Medicaid Other | Admitting: Medical

## 2017-07-07 ENCOUNTER — Encounter: Payer: Self-pay | Admitting: Medical

## 2017-07-07 VITALS — BP 112/64 | HR 74 | Wt 132.4 lb

## 2017-07-07 DIAGNOSIS — Z3400 Encounter for supervision of normal first pregnancy, unspecified trimester: Secondary | ICD-10-CM

## 2017-07-07 NOTE — Progress Notes (Signed)
   PRENATAL VISIT NOTE  Subjective:  Linda Zavala is a 21 y.o. G1P0 at [redacted]w[redacted]d being seen today for ongoing prenatal care.  She is currently monitored for the following issues for this low-risk pregnancy and has Supervision of normal first pregnancy, antepartum on their problem list.  Patient reports no complaints.  Contractions: Not present. Vag. Bleeding: None.  Movement: Present. Denies leaking of fluid.   The following portions of the patient's history were reviewed and updated as appropriate: allergies, current medications, past family history, past medical history, past social history, past surgical history and problem list. Problem list updated.  Objective:   Vitals:   07/07/17 1248  BP: 112/64  Pulse: 74  Weight: 132 lb 6.4 oz (60.1 kg)    Fetal Status: Fetal Heart Rate (bpm): 143 Fundal Height: 36 cm Movement: Present     General:  Alert, oriented and cooperative. Patient is in no acute distress.  Skin: Skin is warm and dry. No rash noted.   Cardiovascular: Normal heart rate noted  Respiratory: Normal respiratory effort, no problems with respiration noted  Abdomen: Soft, gravid, appropriate for gestational age.  Pain/Pressure: Absent     Pelvic: Cervical exam deferred        Extremities: Normal range of motion.  Edema: None  Mental Status:  Normal mood and affect. Normal behavior. Normal judgment and thought content.   Assessment and Plan:  Pregnancy: G1P0 at [redacted]w[redacted]d  1. Supervision of normal first pregnancy, antepartum - Doing well  Term labor symptoms and general obstetric precautions including but not limited to vaginal bleeding, contractions, leaking of fluid and fetal movement were reviewed in detail with the patient. Please refer to After Visit Summary for other counseling recommendations.  Return in about 1 week (around 07/14/2017) for LOB.   Vonzella NippleJulie Wenzel, PA-C

## 2017-07-07 NOTE — Patient Instructions (Signed)
Fetal Movement Counts °Patient Name: ________________________________________________ Patient Due Date: ____________________ °What is a fetal movement count? °A fetal movement count is the number of times that you feel your baby move during a certain amount of time. This may also be called a fetal kick count. A fetal movement count is recommended for every pregnant woman. You may be asked to start counting fetal movements as early as week 28 of your pregnancy. °Pay attention to when your baby is most active. You may notice your baby's sleep and wake cycles. You may also notice things that make your baby move more. You should do a fetal movement count: °· When your baby is normally most active. °· At the same time each day. ° °A good time to count movements is while you are resting, after having something to eat and drink. °How do I count fetal movements? °1. Find a quiet, comfortable area. Sit, or lie down on your side. °2. Write down the date, the start time and stop time, and the number of movements that you felt between those two times. Take this information with you to your health care visits. °3. For 2 hours, count kicks, flutters, swishes, rolls, and jabs. You should feel at least 10 movements during 2 hours. °4. You may stop counting after you have felt 10 movements. °5. If you do not feel 10 movements in 2 hours, have something to eat and drink. Then, keep resting and counting for 1 hour. If you feel at least 4 movements during that hour, you may stop counting. °Contact a health care provider if: °· You feel fewer than 4 movements in 2 hours. °· Your baby is not moving like he or she usually does. °Date: ____________ Start time: ____________ Stop time: ____________ Movements: ____________ °Date: ____________ Start time: ____________ Stop time: ____________ Movements: ____________ °Date: ____________ Start time: ____________ Stop time: ____________ Movements: ____________ °Date: ____________ Start time:  ____________ Stop time: ____________ Movements: ____________ °Date: ____________ Start time: ____________ Stop time: ____________ Movements: ____________ °Date: ____________ Start time: ____________ Stop time: ____________ Movements: ____________ °Date: ____________ Start time: ____________ Stop time: ____________ Movements: ____________ °Date: ____________ Start time: ____________ Stop time: ____________ Movements: ____________ °Date: ____________ Start time: ____________ Stop time: ____________ Movements: ____________ °This information is not intended to replace advice given to you by your health care provider. Make sure you discuss any questions you have with your health care provider. °Document Released: 08/27/2006 Document Revised: 03/26/2016 Document Reviewed: 09/06/2015 °Elsevier Interactive Patient Education © 2018 Elsevier Inc[employer] °Braxton Hicks Contractions °Contractions of the uterus can occur throughout pregnancy, but they are not always a sign that you are in labor. You may have practice contractions called Braxton Hicks contractions. These false labor contractions are sometimes confused with true labor. °What are Braxton Hicks contractions? °Braxton Hicks contractions are tightening movements that occur in the muscles of the uterus before labor. Unlike true labor contractions, these contractions do not result in opening (dilation) and thinning of the cervix. Toward the end of pregnancy (32-34 weeks), Braxton Hicks contractions can happen more often and may become stronger. These contractions are sometimes difficult to tell apart from true labor because they can be very uncomfortable. You should not feel embarrassed if you go to the hospital with false labor. °Sometimes, the only way to tell if you are in true labor is for your health care provider to look for changes in the cervix. The health care provider will do a physical exam and may monitor your contractions. If   you are not in true labor, the exam  should show that your cervix is not dilating and your water has not broken. °If there are no prenatal problems or other health problems associated with your pregnancy, it is completely safe for you to be sent home with false labor. You may continue to have Braxton Hicks contractions until you go into true labor. °How can I tell the difference between true labor and false labor? °· Differences °? False labor °? Contractions last 30-70 seconds.: Contractions are usually shorter and not as strong as true labor contractions. °? Contractions become very regular.: Contractions are usually irregular. °? Discomfort is usually felt in the top of the uterus, and it spreads to the lower abdomen and low back.: Contractions are often felt in the front of the lower abdomen and in the groin. °? Contractions do not go away with walking.: Contractions may go away when you walk around or change positions while lying down. °? Contractions usually become more intense and increase in frequency.: Contractions get weaker and are shorter-lasting as time goes on. °? The cervix dilates and gets thinner.: The cervix usually does not dilate or become thin. °Follow these instructions at home: °· Take over-the-counter and prescription medicines only as told by your health care provider. °· Keep up with your usual exercises and follow other instructions from your health care provider. °· Eat and drink lightly if you think you are going into labor. °· If Braxton Hicks contractions are making you uncomfortable: °? Change your position from lying down or resting to walking, or change from walking to resting. °? Sit and rest in a tub of warm water. °? Drink enough fluid to keep your urine clear or pale yellow. Dehydration may cause these contractions. °? Do slow and deep breathing several times an hour. °· Keep all follow-up prenatal visits as told by your health care provider. This is important. °Contact a health care provider if: °· You have a  fever. °· You have continuous pain in your abdomen. °Get help right away if: °· Your contractions become stronger, more regular, and closer together. °· You have fluid leaking or gushing from your vagina. °· You pass blood-tinged mucus (bloody show). °· You have bleeding from your vagina. °· You have low back pain that you never had before. °· You feel your baby’s head pushing down and causing pelvic pressure. °· Your baby is not moving inside you as much as it used to. °Summary °· Contractions that occur before labor are called Braxton Hicks contractions, false labor, or practice contractions. °· Braxton Hicks contractions are usually shorter, weaker, farther apart, and less regular than true labor contractions. True labor contractions usually become progressively stronger and regular and they become more frequent. °· Manage discomfort from Braxton Hicks contractions by changing position, resting in a warm bath, drinking plenty of water, or practicing deep breathing. °This information is not intended to replace advice given to you by your health care provider. Make sure you discuss any questions you have with your health care provider. °Document Released: 07/28/2005 Document Revised: 06/16/2016 Document Reviewed: 06/16/2016 °Elsevier Interactive Patient Education © 2017 Elsevier Inc. ° °

## 2017-07-14 ENCOUNTER — Ambulatory Visit (INDEPENDENT_AMBULATORY_CARE_PROVIDER_SITE_OTHER): Payer: Medicaid Other | Admitting: Medical

## 2017-07-14 ENCOUNTER — Encounter: Payer: Self-pay | Admitting: Medical

## 2017-07-14 VITALS — BP 109/66 | HR 72 | Wt 134.2 lb

## 2017-07-14 DIAGNOSIS — Z3400 Encounter for supervision of normal first pregnancy, unspecified trimester: Secondary | ICD-10-CM

## 2017-07-14 DIAGNOSIS — Z3403 Encounter for supervision of normal first pregnancy, third trimester: Secondary | ICD-10-CM

## 2017-07-14 NOTE — Progress Notes (Signed)
   PRENATAL VISIT NOTE  Subjective:  Linda Zavala is a 21 y.o. G1P0 at 36105w6d being seen today for ongoing prenatal care.  She is currently monitored for the following issues for this low-risk pregnancy and has Supervision of normal first pregnancy, antepartum on their problem list.  Patient reports intermittent right groin pain.  Contractions: Not present. Vag. Bleeding: None.  Movement: Present. Denies leaking of fluid.   The following portions of the patient's history were reviewed and updated as appropriate: allergies, current medications, past family history, past medical history, past social history, past surgical history and problem list. Problem list updated.  Objective:   Vitals:   07/14/17 1247  BP: 109/66  Pulse: 72  Weight: 134 lb 3.2 oz (60.9 kg)    Fetal Status: Fetal Heart Rate (bpm): 138   Movement: Present   Fundal Height: 35.5  General:  Alert, oriented and cooperative. Patient is in no acute distress.  Skin: Skin is warm and dry. No rash noted.   Cardiovascular: Normal heart rate noted     Abdomen: Soft, gravid, appropriate for gestational age.   Pain/Pressure:absent  Pelvic: Cervical exam deferred        Extremities: Normal range of motion.  Edema: None  Mental Status:  Normal mood and affect. Normal behavior. Normal judgment and thought content.   Assessment and Plan:  Pregnancy: G1P0 at 44105w6d  1.Supervision of normal first pregnancy, antepartum  -Doing well  There are no diagnoses linked to this encounter. Term labor symptoms and general obstetric precautions including but not limited to vaginal bleeding, contractions, leaking of fluid and fetal movement were reviewed in detail with the patient. Please refer to After Visit Summary for other counseling recommendations.  Follow up next week  Karee Forge, Student-PA

## 2017-07-14 NOTE — Progress Notes (Signed)
   PRENATAL VISIT NOTE  Subjective:  Linda Zavala is a 21 y.o. G1P0 at 3573w6d being seen today for ongoing prenatal care.  She is currently monitored for the following issues for this low-risk pregnancy and has Supervision of normal first pregnancy, antepartum on their problem list.  Patient reports round ligament pain.  Contractions: Not present. Vag. Bleeding: None.  Movement: Present. Denies leaking of fluid.   The following portions of the patient's history were reviewed and updated as appropriate: allergies, current medications, past family history, past medical history, past social history, past surgical history and problem list. Problem list updated.  Objective:   Vitals:   07/14/17 1247  BP: 109/66  Pulse: 72  Weight: 134 lb 3.2 oz (60.9 kg)    Fetal Status: Fetal Heart Rate (bpm): 138 Fundal Height: 37 cm Movement: Present     General:  Alert, oriented and cooperative. Patient is in no acute distress.  Skin: Skin is warm and dry. No rash noted.   Cardiovascular: Normal heart rate noted  Respiratory: Normal respiratory effort, no problems with respiration noted  Abdomen: Soft, gravid, appropriate for gestational age.  Pain/Pressure: Present     Pelvic: Cervical exam deferred        Extremities: Normal range of motion.  Edema: None  Mental Status:  Normal mood and affect. Normal behavior. Normal judgment and thought content.   Assessment and Plan:  Pregnancy: G1P0 at 5373w6d  1. Supervision of normal first pregnancy, antepartum  2. Round ligament pain - Discussed use of abdominal binder - Tylenol PRN for pain   Term labor symptoms and general obstetric precautions including but not limited to vaginal bleeding, contractions, leaking of fluid and fetal movement were reviewed in detail with the patient. Please refer to After Visit Summary for other counseling recommendations.  Return in about 1 week (around 07/21/2017) for LOB.   Vonzella NippleJulie Taeko Schaffer, PA-C

## 2017-07-14 NOTE — Patient Instructions (Signed)
Fetal Movement Counts °Patient Name: ________________________________________________ Patient Due Date: ____________________ °What is a fetal movement count? °A fetal movement count is the number of times that you feel your baby move during a certain amount of time. This may also be called a fetal kick count. A fetal movement count is recommended for every pregnant woman. You may be asked to start counting fetal movements as early as week 28 of your pregnancy. °Pay attention to when your baby is most active. You may notice your baby's sleep and wake cycles. You may also notice things that make your baby move more. You should do a fetal movement count: °· When your baby is normally most active. °· At the same time each day. ° °A good time to count movements is while you are resting, after having something to eat and drink. °How do I count fetal movements? °1. Find a quiet, comfortable area. Sit, or lie down on your side. °2. Write down the date, the start time and stop time, and the number of movements that you felt between those two times. Take this information with you to your health care visits. °3. For 2 hours, count kicks, flutters, swishes, rolls, and jabs. You should feel at least 10 movements during 2 hours. °4. You may stop counting after you have felt 10 movements. °5. If you do not feel 10 movements in 2 hours, have something to eat and drink. Then, keep resting and counting for 1 hour. If you feel at least 4 movements during that hour, you may stop counting. °Contact a health care provider if: °· You feel fewer than 4 movements in 2 hours. °· Your baby is not moving like he or she usually does. °Date: ____________ Start time: ____________ Stop time: ____________ Movements: ____________ °Date: ____________ Start time: ____________ Stop time: ____________ Movements: ____________ °Date: ____________ Start time: ____________ Stop time: ____________ Movements: ____________ °Date: ____________ Start time:  ____________ Stop time: ____________ Movements: ____________ °Date: ____________ Start time: ____________ Stop time: ____________ Movements: ____________ °Date: ____________ Start time: ____________ Stop time: ____________ Movements: ____________ °Date: ____________ Start time: ____________ Stop time: ____________ Movements: ____________ °Date: ____________ Start time: ____________ Stop time: ____________ Movements: ____________ °Date: ____________ Start time: ____________ Stop time: ____________ Movements: ____________ °This information is not intended to replace advice given to you by your health care provider. Make sure you discuss any questions you have with your health care provider. °Document Released: 08/27/2006 Document Revised: 03/26/2016 Document Reviewed: 09/06/2015 °Elsevier Interactive Patient Education © 2018 Elsevier Inc[employer] °Braxton Hicks Contractions °Contractions of the uterus can occur throughout pregnancy, but they are not always a sign that you are in labor. You may have practice contractions called Braxton Hicks contractions. These false labor contractions are sometimes confused with true labor. °What are Braxton Hicks contractions? °Braxton Hicks contractions are tightening movements that occur in the muscles of the uterus before labor. Unlike true labor contractions, these contractions do not result in opening (dilation) and thinning of the cervix. Toward the end of pregnancy (32-34 weeks), Braxton Hicks contractions can happen more often and may become stronger. These contractions are sometimes difficult to tell apart from true labor because they can be very uncomfortable. You should not feel embarrassed if you go to the hospital with false labor. °Sometimes, the only way to tell if you are in true labor is for your health care provider to look for changes in the cervix. The health care provider will do a physical exam and may monitor your contractions. If   you are not in true labor, the exam  should show that your cervix is not dilating and your water has not broken. °If there are no prenatal problems or other health problems associated with your pregnancy, it is completely safe for you to be sent home with false labor. You may continue to have Braxton Hicks contractions until you go into true labor. °How can I tell the difference between true labor and false labor? °· Differences °? False labor °? Contractions last 30-70 seconds.: Contractions are usually shorter and not as strong as true labor contractions. °? Contractions become very regular.: Contractions are usually irregular. °? Discomfort is usually felt in the top of the uterus, and it spreads to the lower abdomen and low back.: Contractions are often felt in the front of the lower abdomen and in the groin. °? Contractions do not go away with walking.: Contractions may go away when you walk around or change positions while lying down. °? Contractions usually become more intense and increase in frequency.: Contractions get weaker and are shorter-lasting as time goes on. °? The cervix dilates and gets thinner.: The cervix usually does not dilate or become thin. °Follow these instructions at home: °· Take over-the-counter and prescription medicines only as told by your health care provider. °· Keep up with your usual exercises and follow other instructions from your health care provider. °· Eat and drink lightly if you think you are going into labor. °· If Braxton Hicks contractions are making you uncomfortable: °? Change your position from lying down or resting to walking, or change from walking to resting. °? Sit and rest in a tub of warm water. °? Drink enough fluid to keep your urine clear or pale yellow. Dehydration may cause these contractions. °? Do slow and deep breathing several times an hour. °· Keep all follow-up prenatal visits as told by your health care provider. This is important. °Contact a health care provider if: °· You have a  fever. °· You have continuous pain in your abdomen. °Get help right away if: °· Your contractions become stronger, more regular, and closer together. °· You have fluid leaking or gushing from your vagina. °· You pass blood-tinged mucus (bloody show). °· You have bleeding from your vagina. °· You have low back pain that you never had before. °· You feel your baby’s head pushing down and causing pelvic pressure. °· Your baby is not moving inside you as much as it used to. °Summary °· Contractions that occur before labor are called Braxton Hicks contractions, false labor, or practice contractions. °· Braxton Hicks contractions are usually shorter, weaker, farther apart, and less regular than true labor contractions. True labor contractions usually become progressively stronger and regular and they become more frequent. °· Manage discomfort from Braxton Hicks contractions by changing position, resting in a warm bath, drinking plenty of water, or practicing deep breathing. °This information is not intended to replace advice given to you by your health care provider. Make sure you discuss any questions you have with your health care provider. °Document Released: 07/28/2005 Document Revised: 06/16/2016 Document Reviewed: 06/16/2016 °Elsevier Interactive Patient Education © 2017 Elsevier Inc. ° °

## 2017-07-21 ENCOUNTER — Inpatient Hospital Stay (HOSPITAL_COMMUNITY): Payer: Medicaid Other | Admitting: Anesthesiology

## 2017-07-21 ENCOUNTER — Encounter (HOSPITAL_COMMUNITY): Payer: Self-pay | Admitting: Emergency Medicine

## 2017-07-21 ENCOUNTER — Inpatient Hospital Stay (HOSPITAL_COMMUNITY)
Admission: AD | Admit: 2017-07-21 | Discharge: 2017-07-23 | DRG: 807 | Disposition: A | Discharge status: Home / Self Care | Payer: Medicaid Other | Source: Ambulatory Visit | Attending: Obstetrics & Gynecology | Admitting: Obstetrics & Gynecology

## 2017-07-21 ENCOUNTER — Encounter: Payer: Self-pay | Admitting: Advanced Practice Midwife

## 2017-07-21 DIAGNOSIS — Z3483 Encounter for supervision of other normal pregnancy, third trimester: Secondary | ICD-10-CM | POA: Diagnosis present

## 2017-07-21 DIAGNOSIS — Z3A39 39 weeks gestation of pregnancy: Secondary | ICD-10-CM

## 2017-07-21 LAB — TYPE AND SCREEN
ABO/RH(D): O POS
Antibody Screen: NEGATIVE

## 2017-07-21 LAB — CBC
HCT: 33.8 % — ABNORMAL LOW (ref 36.0–46.0)
Hemoglobin: 10.9 g/dL — ABNORMAL LOW (ref 12.0–15.0)
MCH: 26.5 pg (ref 26.0–34.0)
MCHC: 32.2 g/dL (ref 30.0–36.0)
MCV: 82.2 fL (ref 78.0–100.0)
PLATELETS: 216 10*3/uL (ref 150–400)
RBC: 4.11 MIL/uL (ref 3.87–5.11)
RDW: 13.5 % (ref 11.5–15.5)
WBC: 12.1 10*3/uL — ABNORMAL HIGH (ref 4.0–10.5)

## 2017-07-21 LAB — RPR: RPR Ser Ql: NONREACTIVE

## 2017-07-21 MED ORDER — TERBUTALINE SULFATE 1 MG/ML IJ SOLN
0.2500 mg | Freq: Once | INTRAMUSCULAR | Status: DC | PRN
  Filled 2017-07-21: qty 1

## 2017-07-21 MED ORDER — ONDANSETRON HCL 4 MG/2ML IJ SOLN: 4.0000 mg | Freq: Four times a day (QID) | INTRAMUSCULAR | Status: DC | PRN

## 2017-07-21 MED ORDER — FENTANYL 2.5 MCG/ML BUPIVACAINE 1/10 % EPIDURAL INFUSION (WH - ANES)
INTRAMUSCULAR | Status: AC
  Filled 2017-07-21: qty 100

## 2017-07-21 MED ORDER — ZOLPIDEM TARTRATE 5 MG PO TABS: 5.0000 mg | ORAL_TABLET | Freq: Every evening | ORAL | Status: DC | PRN

## 2017-07-21 MED ORDER — PHENYLEPHRINE 40 MCG/ML (10ML) SYRINGE FOR IV PUSH (FOR BLOOD PRESSURE SUPPORT)
80.0000 ug | PREFILLED_SYRINGE | INTRAVENOUS | Status: DC | PRN
  Filled 2017-07-21: qty 5

## 2017-07-21 MED ORDER — EPHEDRINE 5 MG/ML INJ
10.0000 mg | INTRAVENOUS | Status: DC | PRN
  Filled 2017-07-21: qty 2

## 2017-07-21 MED ORDER — ACETAMINOPHEN 325 MG PO TABS: 650.0000 mg | ORAL_TABLET | ORAL | Status: DC | PRN

## 2017-07-21 MED ORDER — DIPHENHYDRAMINE HCL 50 MG/ML IJ SOLN: 12.5000 mg | INTRAMUSCULAR | Status: DC | PRN

## 2017-07-21 MED ORDER — DIPHENHYDRAMINE HCL 25 MG PO CAPS: 25.0000 mg | ORAL_CAPSULE | Freq: Four times a day (QID) | ORAL | Status: DC | PRN

## 2017-07-21 MED ORDER — ONDANSETRON HCL 4 MG PO TABS: 4.0000 mg | ORAL_TABLET | ORAL | Status: DC | PRN

## 2017-07-21 MED ORDER — SENNOSIDES-DOCUSATE SODIUM 8.6-50 MG PO TABS
2.0000 | ORAL_TABLET | ORAL | Status: DC
  Administered 2017-07-21 – 2017-07-23 (×2): 2 via ORAL
  Filled 2017-07-21 (×2): qty 2

## 2017-07-21 MED ORDER — FENTANYL 2.5 MCG/ML BUPIVACAINE 1/10 % EPIDURAL INFUSION (WH - ANES)
14.0000 mL/h | INTRAMUSCULAR | Status: DC | PRN
  Administered 2017-07-21: 14 mL/h via EPIDURAL

## 2017-07-21 MED ORDER — ONDANSETRON HCL 4 MG/2ML IJ SOLN: 4.0000 mg | INTRAMUSCULAR | Status: DC | PRN

## 2017-07-21 MED ORDER — OXYTOCIN 40 UNITS IN LACTATED RINGERS INFUSION - SIMPLE MED
1.0000 m[IU]/min | INTRAVENOUS | Status: DC
  Administered 2017-07-21: 2 m[IU]/min via INTRAVENOUS
  Filled 2017-07-21: qty 1000

## 2017-07-21 MED ORDER — COCONUT OIL OIL
1.0000 "application " | TOPICAL_OIL | Status: DC | PRN
  Administered 2017-07-23: 1 via TOPICAL
  Filled 2017-07-21: qty 120

## 2017-07-21 MED ORDER — LACTATED RINGERS IV SOLN
INTRAVENOUS | Status: DC
  Administered 2017-07-21 (×2): via INTRAVENOUS

## 2017-07-21 MED ORDER — WITCH HAZEL-GLYCERIN EX PADS: 1.0000 "application " | MEDICATED_PAD | CUTANEOUS | Status: DC | PRN

## 2017-07-21 MED ORDER — OXYTOCIN 40 UNITS IN LACTATED RINGERS INFUSION - SIMPLE MED: 2.5000 [IU]/h | INTRAVENOUS | Status: DC

## 2017-07-21 MED ORDER — DIBUCAINE 1 % RE OINT: 1.0000 "application " | TOPICAL_OINTMENT | RECTAL | Status: DC | PRN

## 2017-07-21 MED ORDER — FLEET ENEMA 7-19 GM/118ML RE ENEM: 1.0000 | ENEMA | RECTAL | Status: DC | PRN

## 2017-07-21 MED ORDER — OXYTOCIN BOLUS FROM INFUSION
500.0000 mL | Freq: Once | INTRAVENOUS | Status: AC
  Administered 2017-07-21: 500 mL via INTRAVENOUS

## 2017-07-21 MED ORDER — LIDOCAINE HCL (PF) 1 % IJ SOLN
30.0000 mL | INTRAMUSCULAR | Status: DC | PRN
  Filled 2017-07-21: qty 30

## 2017-07-21 MED ORDER — PHENYLEPHRINE 40 MCG/ML (10ML) SYRINGE FOR IV PUSH (FOR BLOOD PRESSURE SUPPORT)
80.0000 ug | PREFILLED_SYRINGE | INTRAVENOUS | Status: DC | PRN
  Administered 2017-07-21: 80 ug via INTRAVENOUS
  Filled 2017-07-21: qty 5

## 2017-07-21 MED ORDER — LACTATED RINGERS IV SOLN
500.0000 mL | Freq: Once | INTRAVENOUS | Status: AC
  Administered 2017-07-21: 750 mL via INTRAVENOUS

## 2017-07-21 MED ORDER — LACTATED RINGERS IV SOLN: 500.0000 mL | INTRAVENOUS | Status: DC | PRN

## 2017-07-21 MED ORDER — BENZOCAINE-MENTHOL 20-0.5 % EX AERO: 1.0000 "application " | INHALATION_SPRAY | CUTANEOUS | Status: DC | PRN

## 2017-07-21 MED ORDER — TETANUS-DIPHTH-ACELL PERTUSSIS 5-2.5-18.5 LF-MCG/0.5 IM SUSP: 0.5000 mL | Freq: Once | INTRAMUSCULAR | Status: DC

## 2017-07-21 MED ORDER — OXYCODONE-ACETAMINOPHEN 5-325 MG PO TABS: 1.0000 | ORAL_TABLET | ORAL | Status: DC | PRN

## 2017-07-21 MED ORDER — PHENYLEPHRINE 40 MCG/ML (10ML) SYRINGE FOR IV PUSH (FOR BLOOD PRESSURE SUPPORT)
PREFILLED_SYRINGE | INTRAVENOUS | Status: AC
  Filled 2017-07-21: qty 10

## 2017-07-21 MED ORDER — IBUPROFEN 600 MG PO TABS
600.0000 mg | ORAL_TABLET | Freq: Four times a day (QID) | ORAL | Status: DC
  Administered 2017-07-21: 600 mg via ORAL
  Filled 2017-07-21: qty 1

## 2017-07-21 MED ORDER — SOD CITRATE-CITRIC ACID 500-334 MG/5ML PO SOLN: 30.0000 mL | ORAL | Status: DC | PRN

## 2017-07-21 MED ORDER — SIMETHICONE 80 MG PO CHEW: 80.0000 mg | CHEWABLE_TABLET | ORAL | Status: DC | PRN

## 2017-07-21 MED ORDER — LIDOCAINE HCL (PF) 1 % IJ SOLN
INTRAMUSCULAR | Status: DC | PRN
  Administered 2017-07-21 (×2): 5 mL

## 2017-07-21 MED ORDER — OXYCODONE-ACETAMINOPHEN 5-325 MG PO TABS
2.0000 | ORAL_TABLET | ORAL | Status: DC | PRN
Start: 2017-07-21 — End: 2017-07-21

## 2017-07-21 MED ORDER — FENTANYL CITRATE (PF) 100 MCG/2ML IJ SOLN
100.0000 ug | INTRAMUSCULAR | Status: DC | PRN
  Administered 2017-07-21 (×2): 100 ug via INTRAVENOUS
  Filled 2017-07-21 (×2): qty 2

## 2017-07-21 MED ORDER — PRENATAL MULTIVITAMIN CH: 1.0000 | ORAL_TABLET | Freq: Every day | ORAL | Status: DC

## 2017-07-21 NOTE — Anesthesia Preprocedure Evaluation (Signed)

## 2017-07-21 NOTE — Anesthesia Pain Management Evaluation Note (Signed)
  CRNA Pain Management Visit Note  Patient: Linda Zavala, 21 y.o., female  "Hello I am a member of the anesthesia team at [facility]. We have an anesthesia team available at all times to provide care throughout the hospital, including epidural management and anesthesia for C-section. I don't know your plan for the delivery whether it a natural birth, water birth, IV sedation, nitrous supplementation, doula or epidural, but we want to meet your pain goals."   1.Was your pain managed to your expectations on prior hospitalizations?   No prior hospitalizations  2.What is your expectation for pain management during this hospitalization?     Labor support without medications  3.How can we help you reach that goal? natural  Record the patient's initial score and the patient's pain goal.   Pain: 7  Pain Goal: 8 [facility] wants you to be able to say your pain was always managed very well.  Lydiah Pong 07/21/2017

## 2017-07-21 NOTE — H&P (Signed)
OBSTETRIC ADMISSION HISTORY AND PHYSICAL  Linda Zavala is a 21 y.o. female G1P0 with IUP at 5970w6d by second trimester u/s presenting for spontaneous onset of labor. She reports +FMs, No LOF, no VB, no blurry vision, headaches or peripheral edema, and RUQ pain.  She plans on breast feeding. She request nothing for birth control. [facility]   Dating: By second trimester u/s consistent with LMP --->  Estimated Date of Delivery: 07/22/17   Clinic  [facility] Prenatal Labs  Dating  LMP consistent with 2nd trimester US Blood type:   O+  Genetic Screen 1 Screen:    AFP:     Quad:     NIPS: too late Antibody: negative  Anatomic US  Normal Rubella:  NON-IMMUNE  GTT Early:  N/A             Third trimester: 78-97-89 RPR:   negative  Flu vaccine  Declined HBsAg:   negative  TDaP vaccine  Declined                               Rhogam: N/A HIV:   negative  Baby Food  Breast                                           GBS: negative  Contraception  nothing Pap: needs PP  Circumcision  Yes if boy, but it's a girl   Pediatrician  list given CF:  Support Person  Alecia LemmingVictor fob SMA  Prenatal Classes  no Hgb electrophoresis: normal   Prenatal History/Complications:  Past Medical History: Past Medical History:  Diagnosis Date  . Medical history non-contributory     Past Surgical History: Past Surgical History:  Procedure Laterality Date  . NO PAST SURGERIES      Obstetrical History: OB History    Gravida Para Term Preterm AB Living   1             SAB TAB Ectopic Multiple Live Births                  Social History: Social History   Socioeconomic History  . Marital status: Married    Spouse name: None  . Number of children: None  . Years of education: None  . Highest education level: None  Social Needs  . Financial resource strain: None  . Food insecurity - worry: None  . Food insecurity - inability: None  . Transportation needs - medical: None  .  Transportation needs - non-medical: None  Occupational History  . None  Tobacco Use  . Smoking status: Never Smoker  . Smokeless tobacco: Never Used  Substance and Sexual Activity  . Alcohol use: No  . Drug use: No  . Sexual activity: Yes  Other Topics Concern  . None  Social History Narrative  . None    Family History: Family History  Problem Relation Age of Onset  . Diabetes Maternal Grandmother   . Hypertension Maternal Grandmother   . Arthritis Maternal Grandmother   . Hypoparathyroidism Maternal Grandmother   . Cancer Maternal Grandfather     Allergies: No Known Allergies  Medications Prior to Admission  Medication Sig Dispense Refill Last Dose  . Pediatric Multiple Vit-C-FA (FLINSTONES GUMMIES OMEGA-3 DHA) CHEW Chew by mouth.   Past Week at Unknown  time     Review of Systems   All systems reviewed and negative except as stated in HPI  Blood pressure 116/76, pulse 81, temperature 98.4 F (36.9 C), temperature source Oral, resp. rate 18, height 4' 10.5" (1.486 m), weight 136 lb (61.7 kg), last menstrual period 10/15/2016. General appearance: alert, cooperative and no distress Lungs: clear to auscultation bilaterally Heart: regular rate and rhythm Abdomen: soft, non-tender; bowel sounds normal Pelvic: n/a Extremities: Homans sign is negative, no sign of DVT DTR's +2 Presentation: cephalic Fetal monitoringBaseline: 130 bpm, Variability: Good {> 6 bpm), Accelerations: Reactive and Decelerations: Absent Uterine activityFrequency: Every 2-4 minutes Dilation: 5 Effacement (%): 90, 100 Station: -1 Exam by:: Camelia Enganielle Simpson RN   Prenatal labs: ABO, Rh: --/--/O POS (09/06 2245) Antibody: Negative (10/10 1134) Rubella: <0.90 (10/10 1135) RPR: Non Reactive (09/28 1120)  HBsAg: Negative (10/10 1135)  HIV:    GBS:   Negative  Prenatal Transfer Tool  Maternal Diabetes: No Genetic Screening: Declined Maternal Ultrasounds/Referrals: Normal Fetal Ultrasounds  or other Referrals:  None Maternal Substance Abuse:  No Significant Maternal Medications:  None Significant Maternal Lab Results: Lab values include: Group B Strep negative  No results found for this or any previous visit (from the past 24 hour(s)).  Patient Active Problem List   Diagnosis Date Noted  . Supervision of normal first pregnancy, antepartum 05/06/2017    Assessment/Plan:  Linda Limberlejandra Galer is a 21 y.o. G1P0 at 2169w6d here for spontaneous onset of labor  #Labor: Expectant management #Pain: Patient desires no pain medication #FWB: Cat 1 #ID:  GBS neg #MOF: Breast #MOC: nothing, options discussed #Circ:  n/a  Rolm Bookbinderaroline M Neill, CNM  07/21/2017, 4:58 AM

## 2017-07-21 NOTE — Anesthesia Procedure Notes (Signed)
Epidural Patient location during procedure: OB  Staffing Anesthesiologist: Ezekiah Massie, MD Performed: anesthesiologist   Preanesthetic Checklist Completed: patient identified, site marked, surgical consent, pre-op evaluation, timeout performed, IV checked, risks and benefits discussed and monitors and equipment checked  Epidural Patient position: sitting Prep: DuraPrep Patient monitoring: heart rate, continuous pulse ox and blood pressure Approach: right paramedian Location: L3-L4 Injection technique: LOR saline  Needle:  Needle type: Tuohy  Needle gauge: 17 G Needle length: 9 cm and 9 Needle insertion depth: 5 cm Catheter type: closed end flexible Catheter size: 20 Guage Catheter at skin depth: 9 cm Test dose: negative  Assessment Events: blood not aspirated, injection not painful, no injection resistance, negative IV test and no paresthesia  Additional Notes Patient identified. Risks/Benefits/Options discussed with patient including but not limited to bleeding, infection, nerve damage, paralysis, failed block, incomplete pain control, headache, blood pressure changes, nausea, vomiting, reactions to medication both or allergic, itching and postpartum back pain. Confirmed with bedside nurse the patient's most recent platelet count. Confirmed with patient that they are not currently taking any anticoagulation, have any bleeding history or any family history of bleeding disorders. Patient expressed understanding and wished to proceed. All questions were answered. Sterile technique was used throughout the entire procedure. Please see nursing notes for vital signs. Test dose was given through epidural needle and negative prior to continuing to dose epidural or start infusion. Warning signs of high block given to the patient including shortness of breath, tingling/numbness in hands, complete motor block, or any concerning symptoms with instructions to call for help. Patient was given  instructions on fall risk and not to get out of bed. All questions and concerns addressed with instructions to call with any issues.     

## 2017-07-21 NOTE — MAU Note (Signed)
Pt reports contractions that started an hour ago that are every 7 mins. Pt denies LOF. Reports some bloody mucous. Reports good fetal movement. States cervix has not been checked recently.

## 2017-07-21 NOTE — Progress Notes (Signed)
Labor Progress Note Linda Zavala is a 21 y.o. G1P0 at 7444w6d presented for spontaneous onset of labor  S:  Patient uncomfortable with contractions but resting in between.   O:  BP 116/76 (BP Location: Left Arm)   Pulse 81   Temp 97.8 F (36.6 C) (Oral)   Resp 19   Ht 4' 10.5" (1.486 m)   Wt 136 lb (61.7 kg)   LMP 10/15/2016   BMI 27.94 kg/m   Fetal Tracing:  Baseline: 130 Variability: moderate Accels: 15x15 Decels: none  Toco: 2-4   CVE: Dilation: 5 Effacement (%): 90, 100 Cervical Position: Posterior Station: -1 Presentation: Vertex Exam by:: Camelia Enganielle Simpson RN   A&P: 21 y.o. G1P0 3544w6d spontaneous onset of labor #Labor: Progressing well. Continue expectant management #Pain: n/a #FWB: Cat 1 #GBS negative  Rolm Bookbinderaroline M Neill, CNM 7:04 AM

## 2017-07-22 MED ORDER — IBUPROFEN 100 MG/5ML PO SUSP
600.0000 mg | Freq: Four times a day (QID) | ORAL | Status: DC
  Administered 2017-07-22 – 2017-07-23 (×3): 600 mg via ORAL
  Filled 2017-07-22 (×11): qty 30

## 2017-07-22 NOTE — Lactation Note (Signed)
This note was copied from a baby's chart. Lactation Consultation Note  Patient Name: Linda Vertell Limberlejandra Viverette [id number]'s Date: 07/22/2017 Reason for consult: Initial assessment;1st time breastfeeding  Initial visit at 20 hours of age.  Mom reports good feedings with some nipple soreness.  Baby is fussy, but mom was wanting baby to have a bath now. LC discussed basics of breastfeeding and encouraged mom to allow baby to nurse on demand and it was okay to delay bath.  Mom holds baby in cradle hold with poor latching baby remains fussy.  LC noted wet diaper and mom changed diaper.   Baby only sucked a few times and fell asleep on mom.   Baby noted to bite at breast and does not extend tongue when sucking.  LC encouraged mom to work on suck training to help extend tongue for nursing.  Mom to call for assist as needed and with continued pain.   LC discussed cluster feeding and encouraged STS with feedings. [facility] resources given and discussed.  Encouraged to feed with early cues on demand.  Early newborn behavior discussed.  Mom to call for assist as needed.    Maternal Data Has patient been taught Hand Expression?: Yes Does the patient have breastfeeding experience prior to this delivery?: No  Feeding Feeding Type: Breast Fed Length of feed: (few minutes baby fussy and diaper changed)  LATCH Score Latch: Repeated attempts needed to sustain latch, nipple held in mouth throughout feeding, stimulation needed to elicit sucking reflex.  Audible Swallowing: A few with stimulation  Type of Nipple: Everted at rest and after stimulation  Comfort (Breast/Nipple): Soft / non-tender  Hold (Positioning): Assistance needed to correctly position infant at breast and maintain latch.  LATCH Score: 7  Interventions Interventions: Breast feeding basics reviewed;Assisted with latch;Skin to skin;Breast massage;Hand express;Breast compression;Adjust position;Support pillows  Lactation Tools Discussed/Used      Consult Status Consult Status: Follow-up Date: 07/23/17 Follow-up type: In-patient    Franz DellJana Amour Zavala 07/22/2017, 3:45 PM

## 2017-07-22 NOTE — Progress Notes (Signed)
Patient tells me she does not require our services. Linda Zavala Interpreter.

## 2017-07-22 NOTE — Anesthesia Postprocedure Evaluation (Signed)
Anesthesia Post Note  Patient: Linda Zavala  Procedure(s) Performed: AN AD HOC LABOR EPIDURAL     Patient location during evaluation: Mother Baby Anesthesia Type: Epidural Level of consciousness: awake and alert and oriented Pain management: satisfactory to patient Vital Signs Assessment: post-procedure vital signs reviewed and stable Respiratory status: spontaneous breathing and nonlabored ventilation Cardiovascular status: stable Postop Assessment: no headache, no backache, no signs of nausea or vomiting, adequate PO intake and patient able to bend at knees (patient up walking) Anesthetic complications: no    Last Vitals:  Vitals:   07/22/17 0145 07/22/17 0515  BP: (!) 98/52 96/60  Pulse: 88 82  Resp: 18 18  Temp: 37 C 36.8 C  SpO2: 98% 98%    Last Pain:  Vitals:   07/22/17 0515  TempSrc: Oral  PainSc:    Pain Goal:                 Madison HickmanGREGORY,Becky Berberian

## 2017-07-22 NOTE — Progress Notes (Signed)
POSTPARTUM PROGRESS NOTE  Post Partum Day 1 Subjective:  Linda Zavala is a 21 y.o. G1P1001 7069w6d s/p SVD and SOL.  No acute events overnight.  Pt denies problems with ambulating, voiding or po intake.  She denies nausea or vomiting.  Pain is well controlled.  She has not had flatus. She has not had bowel movement. She denies any dysuria and endorses minimal hematuria. Lochia Minimal.   Objective: Blood pressure 96/60, pulse 82, temperature 98.3 F (36.8 C), temperature source Oral, resp. rate 18, height 4' 10.5" (1.486 m), weight 61.7 kg (136 lb), last menstrual period 10/15/2016, SpO2 98 %, unknown if currently breastfeeding.  Physical Exam:  General: alert, cooperative and no distress Lochia:normal flow Chest: no respiratory distress Heart:regular rate, distal pulses intact Abdomen: soft, nontender,  Uterine Fundus: firm, appropriately tender DVT Evaluation: No calf swelling or tenderness Extremities: No edema  Recent Labs    07/21/17 0529  HGB 10.9*  HCT 33.8*    Assessment/Plan:  ASSESSMENT: Linda Zavala is a 21 y.o. G1P1001 4069w6d s/p SVD after SOL. Patient is doing well with no complaints. States she is having some difficulty breast feeding but is going to continue to try. Continue routine postpartum care with pain meds prn. Patient is currently undecided on contraceptive method at the moment.  Plan for discharge tomorrow   LOS: 1 day   Atticus  LumDO 07/22/2017, 8:59 AM   I confirm that I have verified the information documented in the physician assistant student's note and that I have also personally reperformed the physical exam and all medical decision making activities.   Thressa ShellerHeather Hogan 10:32 AM 07/22/17

## 2017-07-23 MED ORDER — IBUPROFEN 100 MG/5ML PO SUSP
600.0000 mg | Freq: Four times a day (QID) | ORAL | 0 refills | Status: AC
Start: 2017-07-23 — End: ?

## 2017-07-23 MED ORDER — SENNOSIDES-DOCUSATE SODIUM 8.6-50 MG PO TABS: 2.0000 | ORAL_TABLET | Freq: Every evening | ORAL | 1 refills | Status: AC | PRN

## 2017-07-23 NOTE — Discharge Summary (Signed)
OB Discharge Summary     Patient Name: Linda Zavala DOB: 12-17-1995 MRN: [id number]  Date of admission: 07/21/2017 Delivering MD: Pincus LargePHELPS, Linda Walts Y   Date of discharge: 07/23/2017  Admitting diagnosis: Labor check Intrauterine pregnancy: 636w6d     Secondary diagnosis:  Active Problems:   Normal labor and delivery   NSVD (normal spontaneous vaginal delivery)  Additional problems: none     Discharge diagnosis: Term Pregnancy Delivered                                                                                                Post partum procedures:none  Augmentation: AROM and Pitocin  Complications: None  Hospital course:  Onset of Labor With Vaginal Delivery     21 y.o. yo G1P1001 at 2236w6d was admitted in Latent Labor on 07/21/2017. Patient had an uncomplicated labor course as follows:  Membrane Rupture Time/Date: 1:10 PM ,07/21/2017   Intrapartum Procedures: Episiotomy: None [1]                                         Lacerations:  Labial [10]  Patient had a delivery of a Viable infant. 07/21/2017  Information for the patient's newborn:  Linda Zavala, Linda Zavala [[id number]]  Delivery Method: Vag-Spont    Pateint had an uncomplicated postpartum course.  She is ambulating, tolerating a regular diet, passing flatus, and urinating well. Patient is discharged home in stable condition on 07/23/17.   Physical exam  Vitals:   07/22/17 0145 07/22/17 0515 07/22/17 0945 07/22/17 1724  BP: (!) 98/52 96/60 (!) 96/58 106/64  Pulse: 88 82 78 82  Resp: 18 18 20 18   Temp: 98.6 F (37 C) 98.3 F (36.8 C) 97.6 F (36.4 C) 98.4 F (36.9 C)  TempSrc: Oral Oral Oral Oral  SpO2: 98% 98%    Weight:      Height:       General: alert and comfortable in bed Lochia: appropriate Uterine Fundus: firm Incision: N/A DVT Evaluation: No evidence of DVT seen on physical exam. Labs: Lab Results  Component Value Date   WBC 12.1 (H) 07/21/2017   HGB 10.9 (L) 07/21/2017   HCT 33.8  (L) 07/21/2017   MCV 82.2 07/21/2017   PLT 216 07/21/2017   No flowsheet data found.   Allergies as of 07/23/2017   No Known Allergies     Medication List    TAKE these medications   FLINSTONES GUMMIES OMEGA-3 DHA Chew Chew 1 tablet by mouth daily.   ibuprofen 100 MG/5ML suspension Commonly known as:  ADVIL,MOTRIN Take 30 mLs (600 mg total) by mouth every 6 (six) hours.   senna-docusate 8.6-50 MG tablet Commonly known as:  Senokot-S Take 2 tablets by mouth at bedtime as needed for mild constipation.       Discharge instruction: per After Visit Summary and "Baby and Me Booklet".  Diet: routine diet  Activity: Advance as tolerated. Pelvic rest for 6 weeks.   Outpatient follow up:4 weeks Follow up Appt:No future appointments.  Follow up Visit: Follow-up Information    [facility] Schedule an appointment as soon as possible for a visit.   Specialty:  Obstetrics and Gynecology Why:  For postpartum visit Contact information: [address] [place] [id number] [phone]         Postpartum contraception: None; discussed with patient multiple times with multiple providers  Newborn Data: Live born female  Birth Weight: 7 lb 5.3 oz (3325 g) APGAR: 9, 9  Newborn Delivery   Birth date/time:  07/21/2017 17:53:00 Delivery type:  Vaginal, Spontaneous     Baby Feeding: Breast Disposition:home with mother   07/23/2017 Linda Zavala  Linda Zavala, Linda Zavala  OB FELLOW DISCHARGE ATTESTATION  I have seen and examined this patient. I agree with above documentation and have made edits as needed.   Caryl AdaJazma Malkie Wille, DO OB Fellow 8:52 PM

## 2017-07-23 NOTE — Progress Notes (Signed)
LCSW consulted for LPN at 29 weeks  Patient was consistent with prenatal care at 29 weeks with no concerns during prenatal care. Insurance was the barriers prior to care.  LCSW will follow cord and act appropriately regarding outcome of the cord. No concerns at this time. No concerns during prenatal care documented.   Deretha EmoryHannah Stephfon Bovey LCSW, MSW Clinical Social Work: Optician, [employer]

## 2017-07-23 NOTE — Discharge Instructions (Signed)

## 2017-07-23 NOTE — Lactation Note (Signed)
This note was copied from a baby's chart. Lactation Consultation Note  Patient Name: Linda Zavala UJWJX'BToday's Date: 07/23/2017 Reason for consult: Follow-up assessment   Baby 40 hours old.  Baby latched upon entering. Taught mother how to flange bottom lip for improved comfort. Intermittent sucks and swallows observed. Reviewed engorgement care and monitoring voids/stools. Mom encouraged to feed baby 8-12 times/24 hours and with feeding cues.  Provided mother w/ coconut oil and hand pump.   Maternal Data    Feeding Feeding Type: Breast Fed Length of feed: 60 min  LATCH Score Latch: Grasps breast easily, tongue down, lips flanged, rhythmical sucking.(latched upon entering)  Audible Swallowing: A few with stimulation  Type of Nipple: Everted at rest and after stimulation  Comfort (Breast/Nipple): Filling, red/small blisters or bruises, mild/mod discomfort  Hold (Positioning): No assistance needed to correctly position infant at breast.  LATCH Score: 8  Interventions Interventions: Breast feeding basics reviewed;Coconut oil;Hand pump  Lactation Tools Discussed/Used     Consult Status Consult Status: Complete    Hardie PulleyBerkelhammer, Jina Olenick Boschen 07/23/2017, 10:22 AM

## 2017-09-02 ENCOUNTER — Ambulatory Visit: Payer: Self-pay | Admitting: Advanced Practice Midwife

## 2017-09-02 NOTE — Progress Notes (Deleted)
No-show for PP visit. Was not planning BC per Problem list and hospital notes. Will call to reschedule PP visit. Needs Pap.   Linda Zavala, [name], [name] 09/02/2017 10:47 AM

## 2017-09-25 ENCOUNTER — Ambulatory Visit: Payer: Self-pay | Admitting: Student

## 2017-10-12 ENCOUNTER — Encounter: Payer: Self-pay | Admitting: Medical

## 2017-10-12 ENCOUNTER — Ambulatory Visit (INDEPENDENT_AMBULATORY_CARE_PROVIDER_SITE_OTHER): Payer: Self-pay | Admitting: Medical

## 2017-10-12 DIAGNOSIS — Z124 Encounter for screening for malignant neoplasm of cervix: Secondary | ICD-10-CM

## 2017-10-12 DIAGNOSIS — Z01419 Encounter for gynecological examination (general) (routine) without abnormal findings: Secondary | ICD-10-CM

## 2017-10-12 NOTE — Patient Instructions (Signed)

## 2017-10-12 NOTE — Progress Notes (Signed)
Post Partum Exam  Linda Zavala is a 22 y.o. 801P1001 female who presents for a postpartum visit. She is 12 weeks postpartum following a vaginal delivery. I have fully reviewed the prenatal and intrapartum course. The delivery was at 39.6 gestational weeks.  Anesthesia: epidural.  Postpartum course has been unremarkable. Baby's course has been unremarkable. Baby is feeding by bottle. Bleeding none. Bowel function is normal. Bladder function is normal. Patient is sexually active. Contraception method is condoms. Postpartum depression screening:4  The following portions of the patient's history were reviewed and updated as appropriate: allergies, current medications, past family history, past medical history, past social history, past surgical history and problem list. Last pap smear done - never  Review of Systems Pertinent items are noted in HPI.    Objective:  Blood pressure (!) 83/64, pulse 77, height 4' 10.5" (1.486 m), weight 124 lb 14.4 oz (56.7 kg), last menstrual period 09/28/2017, not currently breastfeeding.  General:  alert and cooperative   Breasts:  not performed  Lungs: clear to auscultation bilaterally  Heart:  regular rate and rhythm, S1, S2 normal, no murmur, click, rub or gallop  Abdomen: soft, non-tender; bowel sounds normal; no masses,  no organomegaly   Vulva:  normal  Vagina: normal vagina, no discharge, exudate, lesion, or erythema  Cervix:  normal contour, mild friability, scant bleeding following pap  Corpus: normal size, contour, position, consistency, mobility, non-tender  Adnexa:  normal adnexa and no mass, fullness, tenderness  Rectal Exam: Not performed.        Assessment:    Normal postpartum exam. Pap smear done at today's visit.   Plan:   1. Contraception: condoms 2. Pap smear today. Patient will be contacted with abnormal results.  3. Follow up in: 1 year for annual exam or sooner as needed or if she decides to initiate birth control.   Marny LowensteinWenzel,  Doriana Mazurkiewicz N, PA-C 10/12/2017 5:57 PM

## 2017-10-14 LAB — CYTOLOGY - PAP: Diagnosis: NEGATIVE

## 2018-11-23 IMAGING — US US MFM OB DETAIL+14 WK
1 series · 13 of 28 positions shown · non-contrast
Comparison: none

[Series 1: us mfm ob detail+14 wk · 13 of 86 slices shown]
[im 4/86]
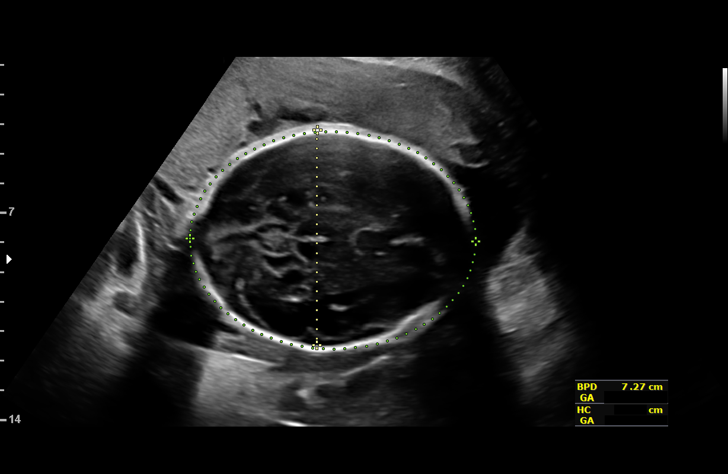
[im 10/86]
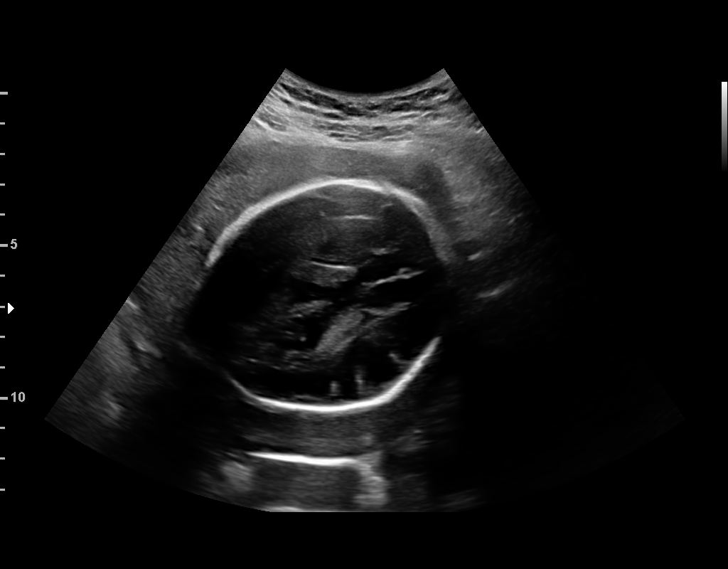
[im 16/86]
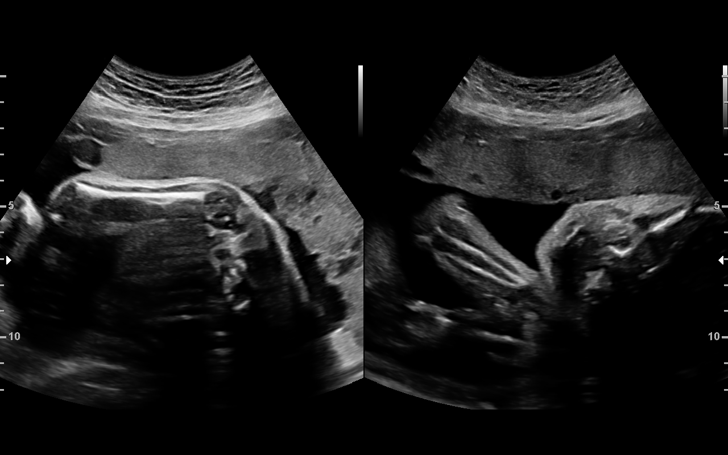
[im 23/86]
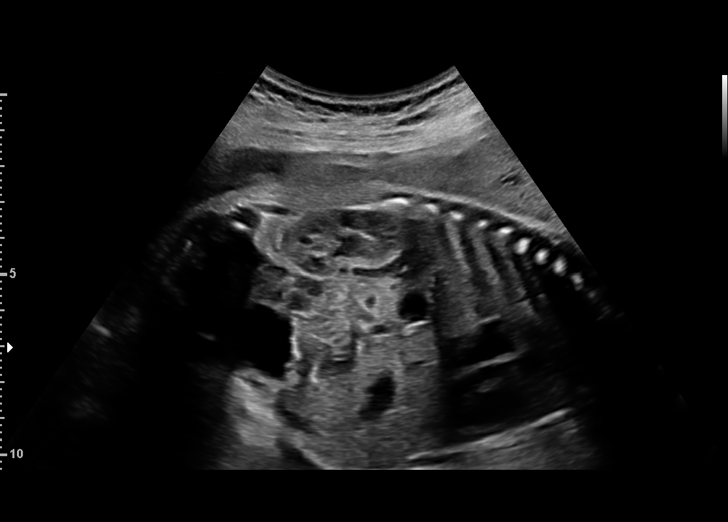
[im 29/86]
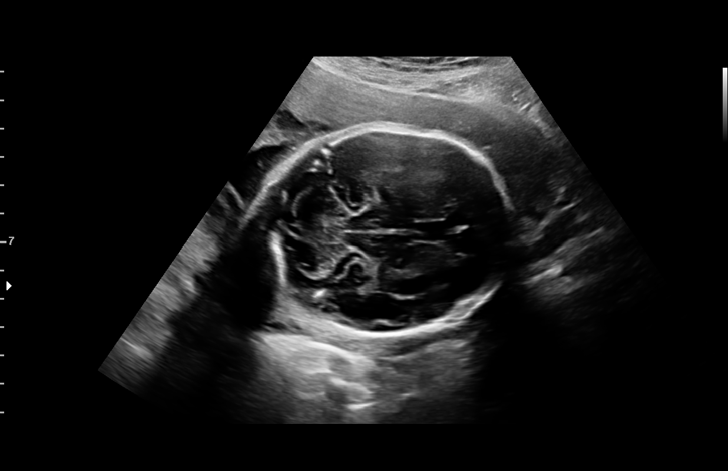
[im 35/86]
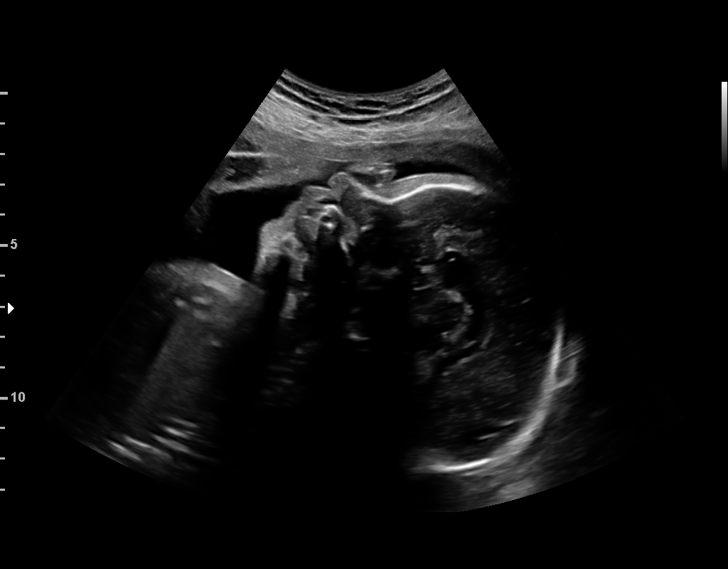
[im 45/86]
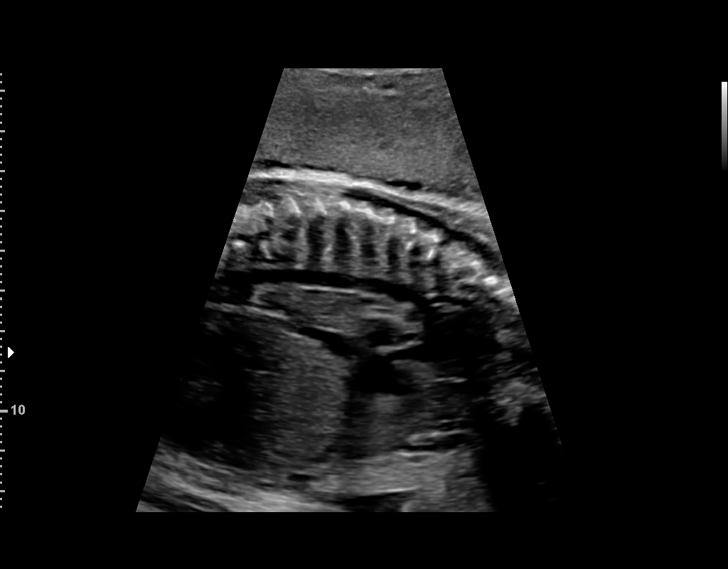
[im 51/86]
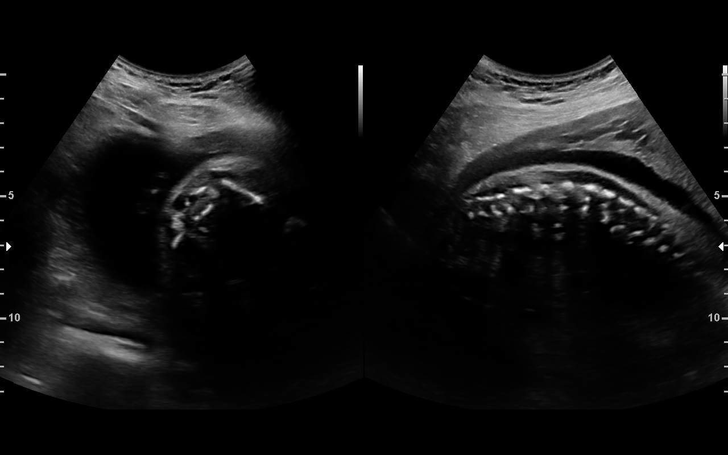
[im 57/86]
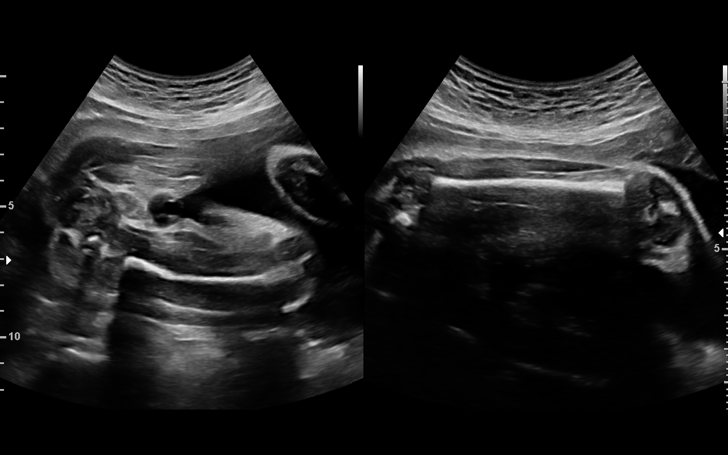
[im 63/86]
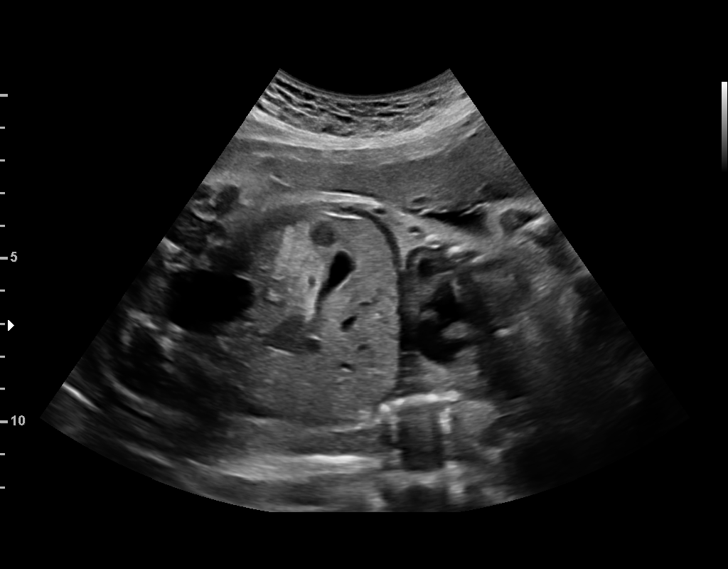
[im 70/86]
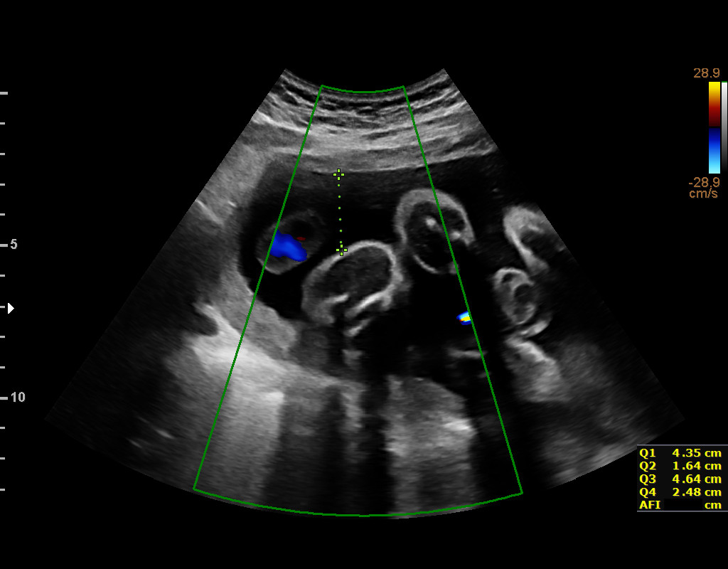
[im 76/86]
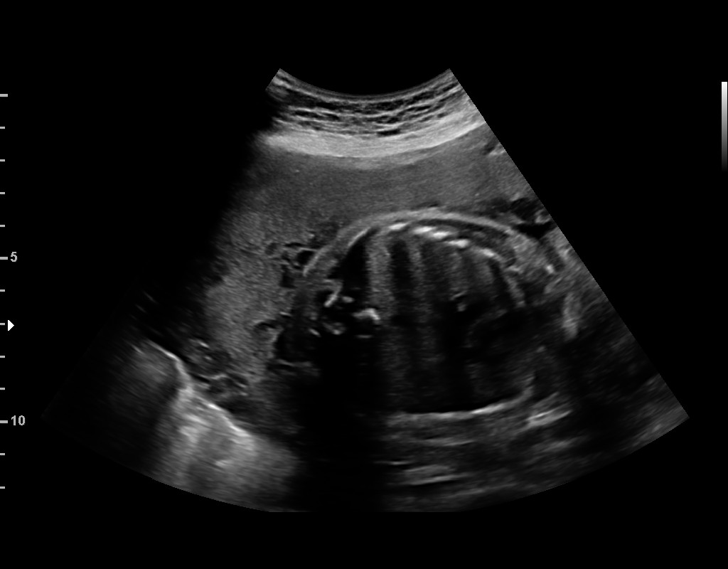
[im 82/86]
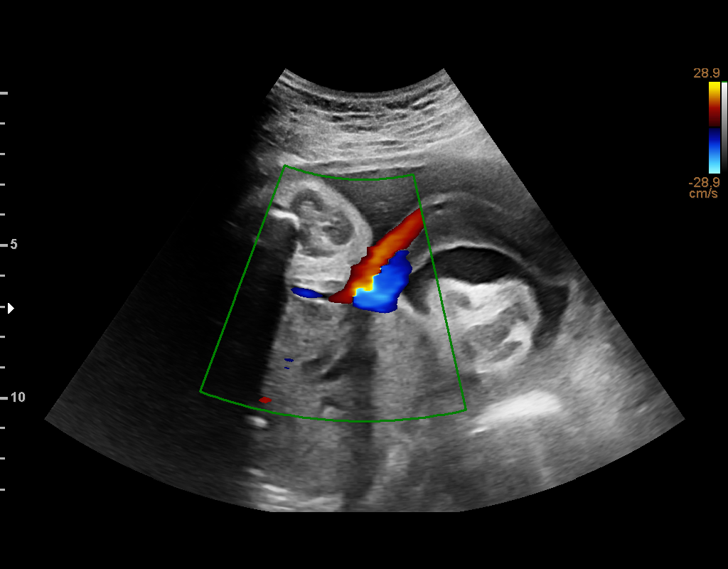

[13 of 28 positions shown; findings below may reference images not displayed]

OB/Gyn Clinic

1  FOGAING KOSMA             [id number]      [id number]     [id number]
Indications

29 weeks gestation of pregnancy
Encounter for fetal anatomic survey
Late to prenatal care, third trimester (DnitialG1V.WW
PNC at 12w3d)
OB History

Gravidity:    1         Term:   0        Prem:   0        SAB:   0
TOP:          0       Ectopic:  0        Living: 0
Fetal Evaluation

Num Of Fetuses:     1
Fetal Heart         137
Rate(bpm):
Cardiac Activity:   Observed
Presentation:       Cephalic
Placenta:           Anterior Right, above cervical os
P. Cord Insertion:  Visualized

Amniotic Fluid
AFI FV:      Subjectively within normal limits

AFI Sum(cm)     %Tile       Largest Pocket(cm)
13.11           38

RUQ(cm)       RLQ(cm)       LUQ(cm)        LLQ(cm)
4.35
Biometry

BPD:      73.5  mm     G. Age:  29w 4d         44  %    CI:         72.8   %   70 - 86
FL/HC:      21.5   %   19.6 -
HC:      273.9  mm     G. Age:  29w 6d         35  %    HC/AC:      1.02       0.99 -
AC:      267.6  mm     G. Age:  30w 6d         85  %    FL/BPD:     80.1   %   71 - 87
FL:       58.9  mm     G. Age:  30w 5d         76  %    FL/AC:      22.0   %   20 - 24
HUM:      50.1  mm     G. Age:  29w 3d         48  %

Est. FW:    8185  gm      3 lb 9 oz     76  %
Gestational Age

LMP:           29w 2d       Date:   10/15/16                 EDD:   07/22/17
U/S Today:     30w 2d                                        EDD:   07/15/17
Best:          29w 2d    Det. By:   LMP  (10/15/16)          EDD:   07/22/17
Anatomy

Cranium:               Appears normal         Aortic Arch:            Appears normal
Cavum:                 Appears normal         Ductal Arch:            Appears normal
Ventricles:            Appears normal         Diaphragm:              Appears normal
Choroid Plexus:        Appears normal         Stomach:                Appears normal, left
sided
Cerebellum:            Appears normal         Abdomen:                Appears normal
Posterior Fossa:       Appears normal         Abdominal Wall:         Appears nml (cord
insert, abd wall)
Nuchal Fold:           Not applicable (>20    Cord Vessels:           Appears normal (3
wks GA)                                        vessel cord)
Face:                  Appears normal         Kidneys:                Appear normal
(orbits and profile)
Lips:                  Appears normal         Bladder:                Appears normal
Thoracic:              Appears normal         Spine:                  Appears normal
Heart:                 Appears normal         Upper Extremities:      Appears normal
(4CH, axis, and
situs)
RVOT:                  Appears normal         Lower Extremities:      Appears normal
LVOT:                  Appears normal

Other:  Fetus appears to be a female. Heels visualized. Nasal bone
visualized. Technically difficult due to advanced gestational age.
Cervix Uterus Adnexa

Cervix
Not visualized (advanced GA >06wks)

Uterus
No abnormality visualized.

Left Ovary
Not visualized.

Right Ovary
Not visualized.

Cul De Sac:   No free fluid seen.

Adnexa:       No abnormality visualized.
Impression

Singleton intrauterine pregnancy at 29 weeks 2 days
gestation with fetal cardiac activity
Cephalic presentation
Anterior placenta without evidence of previa
Normal appearing fetal growth and amniotic fluid volume
No apparent birth defects noted on fetal anatomic survey
Recommendations

Follow-up ultrasounds as clinically indicated.

## 2020-02-02 ENCOUNTER — Ambulatory Visit: Payer: Self-pay | Attending: Internal Medicine

## 2020-02-02 DIAGNOSIS — Z23 Encounter for immunization: Secondary | ICD-10-CM

## 2020-02-02 NOTE — Progress Notes (Signed)
   Covid-19 Vaccination Clinic  Name:  Linda Zavala    MRN: [id number] DOB: Mar 31, 1996  02/02/2020  [name] was observed post Covid-19 immunization for 15 minutes without incident. She was provided with Vaccine Information Sheet and instruction to access the V-Safe system.   [name] was instructed to call 911 with any severe reactions post vaccine: Marland Kitchen Difficulty breathing  . Swelling of face and throat  . A fast heartbeat  . A bad rash all over body  . Dizziness and weakness   Immunizations Administered    Name Date Dose VIS Date Route   Pfizer COVID-19 Vaccine 02/02/2020  3:08 PM 0.3 mL 10/05/2018 Intramuscular   Manufacturer: [employer], [employer]   Lot: [id number]   NDC: [address]-3

## 2020-02-23 ENCOUNTER — Ambulatory Visit: Payer: Medicaid Other

## 2020-03-01 ENCOUNTER — Ambulatory Visit: Payer: Medicaid Other | Attending: Internal Medicine

## 2020-03-01 DIAGNOSIS — Z23 Encounter for immunization: Secondary | ICD-10-CM

## 2020-03-01 NOTE — Progress Notes (Signed)
   Covid-19 Vaccination Clinic  Name:  Linda Zavala    MRN: [id number] DOB: 1996-07-15  03/01/2020  [name] was observed post Covid-19 immunization for 15 minutes without incident. She was provided with Vaccine Information Sheet and instruction to access the V-Safe system.   [name] was instructed to call 911 with any severe reactions post vaccine: Marland Kitchen Difficulty breathing  . Swelling of face and throat  . A fast heartbeat  . A bad rash all over body  . Dizziness and weakness   Immunizations Administered    Name Date Dose VIS Date Route   Pfizer COVID-19 Vaccine 03/01/2020  3:09 PM 0.3 mL 10/05/2018 Intramuscular   Manufacturer: [employer], [employer]   Lot: [id number]   NDC: [address]-3
